# Patient Record
Sex: Female | Born: 1950 | Race: White | Hispanic: No | Marital: Married | Smoking: Never smoker
Health system: Southern US, Community
[De-identification: ages and names within clinical notes are randomized; demographics above are authoritative.]

## PROBLEM LIST (undated history)

## (undated) DIAGNOSIS — C44519 Basal cell carcinoma of skin of other part of trunk: Secondary | ICD-10-CM

## (undated) DIAGNOSIS — K219 Gastro-esophageal reflux disease without esophagitis: Secondary | ICD-10-CM

## (undated) DIAGNOSIS — J302 Other seasonal allergic rhinitis: Secondary | ICD-10-CM

## (undated) DIAGNOSIS — T883XXA Malignant hyperthermia due to anesthesia, initial encounter: Secondary | ICD-10-CM

## (undated) DIAGNOSIS — H409 Unspecified glaucoma: Secondary | ICD-10-CM

## (undated) DIAGNOSIS — Z8489 Family history of other specified conditions: Secondary | ICD-10-CM

## (undated) DIAGNOSIS — M810 Age-related osteoporosis without current pathological fracture: Secondary | ICD-10-CM

## (undated) HISTORY — PX: ABDOMINAL HYSTERECTOMY: SHX81

## (undated) HISTORY — PX: APPENDECTOMY: SHX54

## (undated) HISTORY — PX: OTHER SURGICAL HISTORY: SHX169

---

## 2013-05-19 ENCOUNTER — Encounter (HOSPITAL_COMMUNITY): Payer: Self-pay

## 2013-05-19 ENCOUNTER — Encounter (HOSPITAL_COMMUNITY)
Admission: RE | Admit: 2013-05-19 | Discharge: 2013-05-19 | Disposition: A | Discharge status: Home / Self Care | Payer: No Typology Code available for payment source | Source: Ambulatory Visit | Attending: Internal Medicine | Admitting: Internal Medicine

## 2013-05-19 DIAGNOSIS — M810 Age-related osteoporosis without current pathological fracture: Secondary | ICD-10-CM | POA: Insufficient documentation

## 2013-05-19 HISTORY — DX: Family history of other specified conditions: Z84.89

## 2013-05-19 HISTORY — DX: Gastro-esophageal reflux disease without esophagitis: K21.9

## 2013-05-19 LAB — COMPREHENSIVE METABOLIC PANEL
ALT: 18 U/L (ref 0–35)
AST: 21 U/L (ref 0–37)
Albumin: 3.9 g/dL (ref 3.5–5.2)
Chloride: 103 mEq/L (ref 96–112)
Creatinine, Ser: 0.79 mg/dL (ref 0.50–1.10)
Total Bilirubin: 0.2 mg/dL — ABNORMAL LOW (ref 0.3–1.2)
Total Protein: 7.6 g/dL (ref 6.0–8.3)

## 2013-05-19 LAB — MAGNESIUM: Magnesium: 2.1 mg/dL (ref 1.5–2.5)

## 2013-05-19 LAB — PHOSPHORUS: Phosphorus: 2.8 mg/dL (ref 2.3–4.6)

## 2013-05-19 MED ORDER — DENOSUMAB 60 MG/ML ~~LOC~~ SOLN
60.0000 mg | Freq: Once | SUBCUTANEOUS | Status: AC
  Administered 2013-05-19: 60 mg via SUBCUTANEOUS
  Filled 2013-05-19: qty 1

## 2013-05-19 NOTE — Progress Notes (Signed)
Results for AJAHNAE, RATHGEBER (MRN [id number]) as of 05/19/2013 12:50 Results faxed to Dr. Dwana Melena.  Consent for Prolia obtained along with injection given  Ref. Range 05/19/2013 12:05  Sodium Latest Range: 135-145 mEq/L 140  Potassium Latest Range: 3.5-5.1 mEq/L 4.1  Chloride Latest Range: 96-112 mEq/L 103  CO2 Latest Range: 19-32 mEq/L 28  BUN Latest Range: 6-23 mg/dL 18  Creatinine Latest Range: 0.50-1.10 mg/dL 1.47  Calcium Latest Range: 8.4-10.5 mg/dL 9.8  GFR calc non Af Amer Latest Range: >90 mL/min 86 (L)  GFR calc Af Amer Latest Range: >90 mL/min >90  Glucose Latest Range: 70-99 mg/dL 829 (H)  Phosphorus Latest Range: 2.3-4.6 mg/dL 2.8  Magnesium Latest Range: 1.5-2.5 mg/dL 2.1  Alkaline Phosphatase Latest Range: 39-117 U/L 82  Albumin Latest Range: 3.5-5.2 g/dL 3.9  AST Latest Range: 0-37 U/L 21  ALT Latest Range: 0-35 U/L 18  Total Protein Latest Range: 6.0-8.3 g/dL 7.6  Total Bilirubin Latest Range: 0.3-1.2 mg/dL 0.2 (L)

## 2013-11-10 NOTE — Progress Notes (Signed)
Prolia injection/labs are due for renewal at [facility]. Order sheet faxed to office. Call for questions [phone].

## 2013-11-18 ENCOUNTER — Encounter (HOSPITAL_COMMUNITY)
Admission: RE | Admit: 2013-11-18 | Discharge: 2013-11-18 | Disposition: A | Discharge status: Home / Self Care | Payer: Medicare HMO | Source: Ambulatory Visit | Attending: Internal Medicine | Admitting: Internal Medicine

## 2013-11-18 DIAGNOSIS — M810 Age-related osteoporosis without current pathological fracture: Secondary | ICD-10-CM | POA: Insufficient documentation

## 2013-11-18 LAB — COMPREHENSIVE METABOLIC PANEL
ALT: 18 U/L (ref 0–35)
AST: 22 U/L (ref 0–37)
Albumin: 3.7 g/dL (ref 3.5–5.2)
Alkaline Phosphatase: 71 U/L (ref 39–117)
BUN: 16 mg/dL (ref 6–23)
CALCIUM: 9.3 mg/dL (ref 8.4–10.5)
CO2: 29 mEq/L (ref 19–32)
Chloride: 104 mEq/L (ref 96–112)
Creatinine, Ser: 0.73 mg/dL (ref 0.50–1.10)
GFR calc non Af Amer: 88 mL/min — ABNORMAL LOW (ref >90)
GLUCOSE: 98 mg/dL (ref 70–99)
Potassium: 4.1 mEq/L (ref 3.7–5.3)
Sodium: 142 mEq/L (ref 137–147)
TOTAL PROTEIN: 7.7 g/dL (ref 6.0–8.3)
Total Bilirubin: 0.3 mg/dL (ref 0.3–1.2)

## 2013-11-18 LAB — PHOSPHORUS: Phosphorus: 2.9 mg/dL (ref 2.3–4.6)

## 2013-11-18 LAB — MAGNESIUM: MAGNESIUM: 2.1 mg/dL (ref 1.5–2.5)

## 2013-11-18 MED ORDER — DENOSUMAB 60 MG/ML ~~LOC~~ SOLN
60.0000 mg | Freq: Once | SUBCUTANEOUS | Status: AC
  Administered 2013-11-18: 60 mg via SUBCUTANEOUS
  Filled 2013-11-18: qty 1

## 2014-05-18 ENCOUNTER — Other Ambulatory Visit (HOSPITAL_COMMUNITY): Payer: Self-pay | Admitting: Internal Medicine

## 2014-05-18 DIAGNOSIS — Z1231 Encounter for screening mammogram for malignant neoplasm of breast: Secondary | ICD-10-CM

## 2014-05-18 DIAGNOSIS — M810 Age-related osteoporosis without current pathological fracture: Secondary | ICD-10-CM

## 2014-05-19 ENCOUNTER — Other Ambulatory Visit (HOSPITAL_COMMUNITY): Payer: Self-pay | Admitting: Respiratory Therapy

## 2014-05-19 DIAGNOSIS — R0602 Shortness of breath: Secondary | ICD-10-CM

## 2014-05-20 ENCOUNTER — Ambulatory Visit (HOSPITAL_COMMUNITY)
Admission: RE | Admit: 2014-05-20 | Discharge: 2014-05-20 | Disposition: A | Discharge status: Home / Self Care | Payer: Medicare HMO | Source: Ambulatory Visit | Attending: Internal Medicine | Admitting: Internal Medicine

## 2014-05-20 ENCOUNTER — Encounter (HOSPITAL_COMMUNITY): Payer: Self-pay

## 2014-05-20 ENCOUNTER — Encounter (HOSPITAL_COMMUNITY)
Admission: RE | Admit: 2014-05-20 | Discharge: 2014-05-20 | Disposition: A | Discharge status: Home / Self Care | Payer: Medicare HMO | Source: Ambulatory Visit | Attending: Internal Medicine | Admitting: Internal Medicine

## 2014-05-20 ENCOUNTER — Other Ambulatory Visit (HOSPITAL_COMMUNITY): Payer: Self-pay | Admitting: Internal Medicine

## 2014-05-20 ENCOUNTER — Other Ambulatory Visit (HOSPITAL_COMMUNITY): Payer: Medicare HMO

## 2014-05-20 ENCOUNTER — Ambulatory Visit (HOSPITAL_COMMUNITY): Payer: Medicare HMO

## 2014-05-20 ENCOUNTER — Encounter (HOSPITAL_COMMUNITY): Admission: RE | Admit: 2014-05-20 | Payer: Medicare HMO | Source: Ambulatory Visit

## 2014-05-20 DIAGNOSIS — R0602 Shortness of breath: Secondary | ICD-10-CM

## 2014-05-20 DIAGNOSIS — R059 Cough, unspecified: Secondary | ICD-10-CM

## 2014-05-20 DIAGNOSIS — M810 Age-related osteoporosis without current pathological fracture: Secondary | ICD-10-CM | POA: Diagnosis present

## 2014-05-20 DIAGNOSIS — R05 Cough: Secondary | ICD-10-CM | POA: Diagnosis not present

## 2014-05-20 HISTORY — DX: Malignant hyperthermia due to anesthesia, initial encounter: T88.3XXA

## 2014-05-20 HISTORY — DX: Unspecified glaucoma: H40.9

## 2014-05-20 MED ORDER — DENOSUMAB 60 MG/ML ~~LOC~~ SOLN
60.0000 mg | Freq: Once | SUBCUTANEOUS | Status: AC
  Administered 2014-05-20: 60 mg via SUBCUTANEOUS
  Filled 2014-05-20: qty 1

## 2014-05-25 ENCOUNTER — Ambulatory Visit (HOSPITAL_COMMUNITY)
Admission: RE | Admit: 2014-05-25 | Discharge: 2014-05-25 | Disposition: A | Discharge status: Home / Self Care | Payer: Medicare HMO | Source: Ambulatory Visit | Attending: Internal Medicine | Admitting: Internal Medicine

## 2014-05-25 DIAGNOSIS — M810 Age-related osteoporosis without current pathological fracture: Secondary | ICD-10-CM

## 2014-05-25 DIAGNOSIS — M818 Other osteoporosis without current pathological fracture: Secondary | ICD-10-CM | POA: Diagnosis present

## 2014-05-26 ENCOUNTER — Ambulatory Visit (HOSPITAL_COMMUNITY)
Admission: RE | Admit: 2014-05-26 | Discharge: 2014-05-26 | Disposition: A | Discharge status: Home / Self Care | Payer: Medicare HMO | Source: Ambulatory Visit | Attending: Internal Medicine | Admitting: Internal Medicine

## 2014-05-26 DIAGNOSIS — R0602 Shortness of breath: Secondary | ICD-10-CM | POA: Insufficient documentation

## 2014-05-26 MED ORDER — ALBUTEROL SULFATE (2.5 MG/3ML) 0.083% IN NEBU
2.5000 mg | INHALATION_SOLUTION | Freq: Once | RESPIRATORY_TRACT | Status: AC
  Administered 2014-05-26: 2.5 mg via RESPIRATORY_TRACT

## 2014-05-27 ENCOUNTER — Ambulatory Visit (HOSPITAL_COMMUNITY)
Admission: RE | Admit: 2014-05-27 | Discharge: 2014-05-27 | Disposition: A | Discharge status: Home / Self Care | Payer: Medicare HMO | Source: Ambulatory Visit | Attending: Internal Medicine | Admitting: Internal Medicine

## 2014-05-27 DIAGNOSIS — Z1231 Encounter for screening mammogram for malignant neoplasm of breast: Secondary | ICD-10-CM | POA: Insufficient documentation

## 2014-05-27 LAB — PULMONARY FUNCTION TEST
DL/VA % PRED: 97 %
DL/VA: 4.29 ml/min/mmHg/L
DLCO UNC % PRED: 82 %
DLCO cor % pred: 82 %
DLCO cor: 16.65 ml/min/mmHg
DLCO unc: 16.65 ml/min/mmHg
FEF 25-75 Post: 2.45 L/sec
FEF 25-75 Pre: 2.24 L/sec
FEF2575-%Change-Post: 9 %
FEF2575-%PRED-POST: 126 %
FEF2575-%Pred-Pre: 116 %
FEV1-%Change-Post: 2 %
FEV1-%PRED-POST: 109 %
FEV1-%Pred-Pre: 107 %
FEV1-PRE: 2.27 L
FEV1-Post: 2.32 L
FEV1FVC-%Change-Post: 5 %
FEV1FVC-%PRED-PRE: 105 %
FEV6-%Change-Post: -2 %
FEV6-%PRED-PRE: 105 %
FEV6-%Pred-Post: 102 %
FEV6-POST: 2.72 L
FEV6-Pre: 2.81 L
FEV6FVC-%CHANGE-POST: 0 %
FEV6FVC-%PRED-PRE: 104 %
FEV6FVC-%Pred-Post: 104 %
FVC-%Change-Post: -3 %
FVC-%PRED-PRE: 101 %
FVC-%Pred-Post: 97 %
FVC-PRE: 2.81 L
FVC-Post: 2.72 L
PRE FEV6/FVC RATIO: 100 %
Post FEV1/FVC ratio: 85 %
Post FEV6/FVC ratio: 100 %
Pre FEV1/FVC ratio: 81 %
RV % PRED: 98 %
RV: 1.93 L
TLC % pred: 99 %
TLC: 4.56 L

## 2014-11-19 ENCOUNTER — Encounter (HOSPITAL_COMMUNITY)
Admission: RE | Admit: 2014-11-19 | Discharge: 2014-11-19 | Disposition: A | Discharge status: Home / Self Care | Payer: Medicare PPO | Source: Ambulatory Visit | Attending: Internal Medicine | Admitting: Internal Medicine

## 2014-11-19 ENCOUNTER — Encounter (HOSPITAL_COMMUNITY): Payer: Self-pay

## 2014-11-19 DIAGNOSIS — M810 Age-related osteoporosis without current pathological fracture: Secondary | ICD-10-CM | POA: Diagnosis not present

## 2014-11-19 LAB — COMPREHENSIVE METABOLIC PANEL
ALBUMIN: 4.3 g/dL (ref 3.5–5.2)
ALT: 27 U/L (ref 0–35)
ANION GAP: 8 (ref 5–15)
AST: 25 U/L (ref 0–37)
Alkaline Phosphatase: 60 U/L (ref 39–117)
BILIRUBIN TOTAL: 0.5 mg/dL (ref 0.3–1.2)
BUN: 19 mg/dL (ref 6–23)
CO2: 26 mmol/L (ref 19–32)
CREATININE: 0.84 mg/dL (ref 0.50–1.10)
Calcium: 9.6 mg/dL (ref 8.4–10.5)
Chloride: 105 mmol/L (ref 96–112)
GFR calc Af Amer: 82 mL/min — ABNORMAL LOW (ref >90)
GFR, EST NON AFRICAN AMERICAN: 71 mL/min — AB (ref >90)
GLUCOSE: 103 mg/dL — AB (ref 70–99)
Potassium: 4.1 mmol/L (ref 3.5–5.1)
SODIUM: 139 mmol/L (ref 135–145)
TOTAL PROTEIN: 7.7 g/dL (ref 6.0–8.3)

## 2014-11-19 LAB — MAGNESIUM: Magnesium: 2.1 mg/dL (ref 1.5–2.5)

## 2014-11-19 LAB — PHOSPHORUS: Phosphorus: 3.6 mg/dL (ref 2.3–4.6)

## 2014-11-19 MED ORDER — DENOSUMAB 60 MG/ML ~~LOC~~ SOLN
60.0000 mg | Freq: Once | SUBCUTANEOUS | Status: AC
  Administered 2014-11-19: 60 mg via SUBCUTANEOUS
  Filled 2014-11-19: qty 1

## 2014-11-19 NOTE — Progress Notes (Signed)
Results for Karen Freeman, Karen Freeman (MRN [id number]) as of 11/19/2014 09:03  Ref. Range 11/19/2014 08:20  Sodium Latest Ref Range: 135-145 mmol/L 139  Potassium Latest Ref Range: 3.5-5.1 mmol/L 4.1  Chloride Latest Ref Range: 96-112 mmol/L 105  CO2 Latest Ref Range: 19-32 mmol/L 26  BUN Latest Ref Range: 6-23 mg/dL 19  Creatinine Latest Ref Range: 0.50-1.10 mg/dL 0.84  Calcium Latest Ref Range: 8.4-10.5 mg/dL 9.6  EGFR (Non-African Amer.) Latest Ref Range: >90 mL/min 71 (L)  EGFR (African American) Latest Ref Range: >90 mL/min 82 (L)  Glucose Latest Ref Range: 70-99 mg/dL 103 (H)  Anion gap Latest Ref Range: 5-15  8  Phosphorus Latest Ref Range: 2.3-4.6 mg/dL 3.6  Magnesium Latest Ref Range: 1.5-2.5 mg/dL 2.1  Alkaline Phosphatase Latest Ref Range: 39-117 U/L 60  Albumin Latest Ref Range: 3.5-5.2 g/dL 4.3  AST Latest Ref Range: 0-37 U/L 25  ALT Latest Ref Range: 0-35 U/L 27  Total Protein Latest Ref Range: 6.0-8.3 g/dL 7.7  Total Bilirubin Latest Ref Range: 0.3-1.2 mg/dL 0.5

## 2015-05-21 ENCOUNTER — Encounter (HOSPITAL_COMMUNITY): Admission: RE | Admit: 2015-05-21 | Payer: Medicare PPO | Source: Ambulatory Visit

## 2015-05-21 ENCOUNTER — Encounter (HOSPITAL_COMMUNITY): Payer: Medicare PPO

## 2015-05-31 ENCOUNTER — Encounter (HOSPITAL_COMMUNITY)
Admission: RE | Admit: 2015-05-31 | Discharge: 2015-05-31 | Disposition: A | Discharge status: Home / Self Care | Payer: Medicare PPO | Source: Ambulatory Visit | Attending: Internal Medicine | Admitting: Internal Medicine

## 2015-05-31 DIAGNOSIS — M810 Age-related osteoporosis without current pathological fracture: Secondary | ICD-10-CM | POA: Diagnosis not present

## 2015-05-31 MED ORDER — DENOSUMAB 60 MG/ML ~~LOC~~ SOLN
60.0000 mg | Freq: Once | SUBCUTANEOUS | Status: AC
  Administered 2015-05-31: 60 mg via SUBCUTANEOUS
  Filled 2015-05-31: qty 1

## 2015-11-22 ENCOUNTER — Encounter (HOSPITAL_COMMUNITY)
Admission: RE | Admit: 2015-11-22 | Discharge: 2015-11-22 | Disposition: A | Discharge status: Home / Self Care | Payer: Medicare PPO | Source: Ambulatory Visit | Attending: Internal Medicine | Admitting: Internal Medicine

## 2015-11-22 DIAGNOSIS — M810 Age-related osteoporosis without current pathological fracture: Secondary | ICD-10-CM | POA: Diagnosis present

## 2015-11-22 MED ORDER — DENOSUMAB 60 MG/ML ~~LOC~~ SOLN
60.0000 mg | Freq: Once | SUBCUTANEOUS | Status: AC
  Administered 2015-11-22: 60 mg via SUBCUTANEOUS
  Filled 2015-11-22: qty 1

## 2016-05-23 ENCOUNTER — Encounter (HOSPITAL_COMMUNITY): Payer: Self-pay

## 2016-05-23 ENCOUNTER — Encounter (HOSPITAL_COMMUNITY)
Admission: RE | Admit: 2016-05-23 | Discharge: 2016-05-23 | Disposition: A | Discharge status: Home / Self Care | Payer: Medicare PPO | Source: Ambulatory Visit | Attending: Internal Medicine | Admitting: Internal Medicine

## 2016-05-23 DIAGNOSIS — M810 Age-related osteoporosis without current pathological fracture: Secondary | ICD-10-CM | POA: Insufficient documentation

## 2016-05-23 MED ORDER — DENOSUMAB 60 MG/ML ~~LOC~~ SOLN
60.0000 mg | Freq: Once | SUBCUTANEOUS | Status: AC
  Administered 2016-05-23: 60 mg via SUBCUTANEOUS
  Filled 2016-05-23: qty 1

## 2016-05-31 ENCOUNTER — Other Ambulatory Visit (HOSPITAL_COMMUNITY): Payer: Self-pay | Admitting: Internal Medicine

## 2016-05-31 DIAGNOSIS — Z1231 Encounter for screening mammogram for malignant neoplasm of breast: Secondary | ICD-10-CM

## 2016-05-31 DIAGNOSIS — Z780 Asymptomatic menopausal state: Secondary | ICD-10-CM

## 2016-06-14 ENCOUNTER — Encounter (HOSPITAL_COMMUNITY): Payer: Self-pay | Admitting: Radiology

## 2016-06-14 ENCOUNTER — Ambulatory Visit (HOSPITAL_COMMUNITY)
Admission: RE | Admit: 2016-06-14 | Discharge: 2016-06-14 | Disposition: A | Discharge status: Home / Self Care | Payer: Medicare PPO | Source: Ambulatory Visit | Attending: Internal Medicine | Admitting: Internal Medicine

## 2016-06-14 DIAGNOSIS — M810 Age-related osteoporosis without current pathological fracture: Secondary | ICD-10-CM | POA: Insufficient documentation

## 2016-06-14 DIAGNOSIS — Z780 Asymptomatic menopausal state: Secondary | ICD-10-CM | POA: Insufficient documentation

## 2016-06-14 DIAGNOSIS — Z1231 Encounter for screening mammogram for malignant neoplasm of breast: Secondary | ICD-10-CM | POA: Diagnosis not present

## 2016-10-19 DIAGNOSIS — D2272 Melanocytic nevi of left lower limb, including hip: Secondary | ICD-10-CM | POA: Diagnosis not present

## 2016-10-19 DIAGNOSIS — D1801 Hemangioma of skin and subcutaneous tissue: Secondary | ICD-10-CM | POA: Diagnosis not present

## 2016-10-19 DIAGNOSIS — Z85828 Personal history of other malignant neoplasm of skin: Secondary | ICD-10-CM | POA: Diagnosis not present

## 2016-10-19 DIAGNOSIS — D2271 Melanocytic nevi of right lower limb, including hip: Secondary | ICD-10-CM | POA: Diagnosis not present

## 2016-10-19 DIAGNOSIS — L821 Other seborrheic keratosis: Secondary | ICD-10-CM | POA: Diagnosis not present

## 2016-10-19 DIAGNOSIS — D225 Melanocytic nevi of trunk: Secondary | ICD-10-CM | POA: Diagnosis not present

## 2016-10-19 DIAGNOSIS — L814 Other melanin hyperpigmentation: Secondary | ICD-10-CM | POA: Diagnosis not present

## 2016-10-19 DIAGNOSIS — D2262 Melanocytic nevi of left upper limb, including shoulder: Secondary | ICD-10-CM | POA: Diagnosis not present

## 2016-11-09 DIAGNOSIS — E782 Mixed hyperlipidemia: Secondary | ICD-10-CM | POA: Diagnosis not present

## 2016-11-09 DIAGNOSIS — I10 Essential (primary) hypertension: Secondary | ICD-10-CM | POA: Diagnosis not present

## 2016-11-09 DIAGNOSIS — R7301 Impaired fasting glucose: Secondary | ICD-10-CM | POA: Diagnosis not present

## 2016-11-14 DIAGNOSIS — R7301 Impaired fasting glucose: Secondary | ICD-10-CM | POA: Diagnosis not present

## 2016-11-14 DIAGNOSIS — J45998 Other asthma: Secondary | ICD-10-CM | POA: Diagnosis not present

## 2016-11-14 DIAGNOSIS — E782 Mixed hyperlipidemia: Secondary | ICD-10-CM | POA: Diagnosis not present

## 2016-11-14 DIAGNOSIS — K219 Gastro-esophageal reflux disease without esophagitis: Secondary | ICD-10-CM | POA: Diagnosis not present

## 2016-11-14 DIAGNOSIS — Z6826 Body mass index (BMI) 26.0-26.9, adult: Secondary | ICD-10-CM | POA: Diagnosis not present

## 2016-11-14 DIAGNOSIS — M810 Age-related osteoporosis without current pathological fracture: Secondary | ICD-10-CM | POA: Diagnosis not present

## 2016-11-21 ENCOUNTER — Encounter (HOSPITAL_COMMUNITY)
Admission: RE | Admit: 2016-11-21 | Discharge: 2016-11-21 | Disposition: A | Discharge status: Home / Self Care | Payer: Medicare HMO | Source: Ambulatory Visit | Attending: Internal Medicine | Admitting: Internal Medicine

## 2016-11-21 DIAGNOSIS — M810 Age-related osteoporosis without current pathological fracture: Secondary | ICD-10-CM | POA: Diagnosis not present

## 2016-11-21 MED ORDER — DENOSUMAB 60 MG/ML ~~LOC~~ SOLN
60.0000 mg | Freq: Once | SUBCUTANEOUS | Status: AC
  Administered 2016-11-21: 60 mg via SUBCUTANEOUS
  Filled 2016-11-21: qty 1

## 2016-11-24 DIAGNOSIS — H401111 Primary open-angle glaucoma, right eye, mild stage: Secondary | ICD-10-CM | POA: Diagnosis not present

## 2016-11-24 DIAGNOSIS — H2513 Age-related nuclear cataract, bilateral: Secondary | ICD-10-CM | POA: Diagnosis not present

## 2016-11-24 DIAGNOSIS — H52203 Unspecified astigmatism, bilateral: Secondary | ICD-10-CM | POA: Diagnosis not present

## 2016-12-21 DIAGNOSIS — Z0000 Encounter for general adult medical examination without abnormal findings: Secondary | ICD-10-CM | POA: Diagnosis not present

## 2017-05-14 DIAGNOSIS — R7301 Impaired fasting glucose: Secondary | ICD-10-CM | POA: Diagnosis not present

## 2017-05-14 DIAGNOSIS — E782 Mixed hyperlipidemia: Secondary | ICD-10-CM | POA: Diagnosis not present

## 2017-05-16 DIAGNOSIS — J45998 Other asthma: Secondary | ICD-10-CM | POA: Diagnosis not present

## 2017-05-16 DIAGNOSIS — E782 Mixed hyperlipidemia: Secondary | ICD-10-CM | POA: Diagnosis not present

## 2017-05-16 DIAGNOSIS — Z23 Encounter for immunization: Secondary | ICD-10-CM | POA: Diagnosis not present

## 2017-05-16 DIAGNOSIS — R7301 Impaired fasting glucose: Secondary | ICD-10-CM | POA: Diagnosis not present

## 2017-05-16 DIAGNOSIS — M810 Age-related osteoporosis without current pathological fracture: Secondary | ICD-10-CM | POA: Diagnosis not present

## 2017-05-16 DIAGNOSIS — Z6823 Body mass index (BMI) 23.0-23.9, adult: Secondary | ICD-10-CM | POA: Diagnosis not present

## 2017-05-16 DIAGNOSIS — K219 Gastro-esophageal reflux disease without esophagitis: Secondary | ICD-10-CM | POA: Diagnosis not present

## 2017-05-18 ENCOUNTER — Other Ambulatory Visit (HOSPITAL_COMMUNITY): Payer: Self-pay | Admitting: Internal Medicine

## 2017-05-18 DIAGNOSIS — Z1231 Encounter for screening mammogram for malignant neoplasm of breast: Secondary | ICD-10-CM

## 2017-05-23 ENCOUNTER — Encounter (HOSPITAL_COMMUNITY): Payer: Medicare HMO

## 2017-05-25 ENCOUNTER — Encounter (HOSPITAL_COMMUNITY): Payer: Medicare HMO

## 2017-05-31 DIAGNOSIS — H401111 Primary open-angle glaucoma, right eye, mild stage: Secondary | ICD-10-CM | POA: Diagnosis not present

## 2017-06-01 ENCOUNTER — Encounter (HOSPITAL_COMMUNITY)
Admission: RE | Admit: 2017-06-01 | Discharge: 2017-06-01 | Disposition: A | Discharge status: Home / Self Care | Payer: Medicare HMO | Source: Ambulatory Visit | Attending: Internal Medicine | Admitting: Internal Medicine

## 2017-06-01 ENCOUNTER — Encounter (HOSPITAL_COMMUNITY): Payer: Self-pay

## 2017-06-01 DIAGNOSIS — Z780 Asymptomatic menopausal state: Secondary | ICD-10-CM | POA: Diagnosis not present

## 2017-06-01 DIAGNOSIS — M810 Age-related osteoporosis without current pathological fracture: Secondary | ICD-10-CM | POA: Diagnosis not present

## 2017-06-01 MED ORDER — DENOSUMAB 60 MG/ML ~~LOC~~ SOLN
60.0000 mg | Freq: Once | SUBCUTANEOUS | Status: AC
  Administered 2017-06-01: 60 mg via SUBCUTANEOUS
  Filled 2017-06-01: qty 1

## 2017-06-18 ENCOUNTER — Ambulatory Visit (HOSPITAL_COMMUNITY): Payer: Medicare HMO

## 2017-06-20 ENCOUNTER — Telehealth: Payer: Self-pay

## 2017-06-20 ENCOUNTER — Encounter (HOSPITAL_COMMUNITY): Payer: Self-pay

## 2017-06-20 ENCOUNTER — Ambulatory Visit (HOSPITAL_COMMUNITY)
Admission: RE | Admit: 2017-06-20 | Discharge: 2017-06-20 | Disposition: A | Discharge status: Home / Self Care | Payer: Medicare HMO | Source: Ambulatory Visit | Attending: Internal Medicine | Admitting: Internal Medicine

## 2017-06-20 DIAGNOSIS — Z1231 Encounter for screening mammogram for malignant neoplasm of breast: Secondary | ICD-10-CM | POA: Diagnosis not present

## 2017-06-20 NOTE — Telephone Encounter (Signed)
Pt received a triage letter to call DS. No GI problems, no blood thinners or hx of heart attacks. Please call [phone] or she said to [facility] because of bad reception in [place].

## 2017-07-02 ENCOUNTER — Telehealth: Payer: Self-pay

## 2017-07-02 NOTE — Telephone Encounter (Signed)
See separate triage.  

## 2017-07-23 NOTE — Telephone Encounter (Signed)
Gastroenterology Pre-Procedure Review  Request Date: 08/10/2017 Requesting Physician: Dr. Wende Neighbors  PATIENT REVIEW QUESTIONS: The patient responded to the following health history questions as indicated:    Last colonoscopy 10 years ago in [place]   No polyps  1. Diabetes Melitis: no 2. Joint replacements in the past 12 months: no 3. Major health problems in the past 3 months: no 4. Has an artificial valve or MVP: no 5. Has a defibrillator: no 6. Has been advised in past to take antibiotics in advance of a procedure like teeth cleaning: no 7. Family history of colon cancer: no  8. Alcohol Use: Drinks a glass of wine 2-3 times a week with dinner 9. History of sleep apnea: no  10. History of coronary artery or other vascular stents placed within the last 12 months: no 11. History of any prior anesthesia complications: no  ( BUT SISTER HAS HYPOTHERMIA WITH ANESTHESIA)    MEDICATIONS & ALLERGIES:    Patient reports the following regarding taking any blood thinners:   Plavix? no Aspirin? no Coumadin? no Brilinta? no Xarelto? no Eliquis? no Pradaxa? no Savaysa? no Effient? no  Patient confirms/reports the following medications:  Current Outpatient Medications  Medication Sig Dispense Refill  . calcium citrate-vitamin D (CITRACAL+D) 315-200 MG-UNIT per tablet Take 1 tablet by mouth 2 (two) times daily.    . cholecalciferol (VITAMIN D) 1000 UNITS tablet Take 2,000 Units by mouth daily.    Marland Kitchen denosumab (PROLIA) 60 MG/ML SOLN injection Inject 60 mg into the skin every 6 (six) months. Administer in upper arm, thigh, or abdomen    . esomeprazole (NEXIUM) 40 MG capsule Take 40 mg by mouth daily before breakfast.    . loratadine (CLARITIN) 10 MG tablet Take 10 mg by mouth daily.    . Multiple Vitamin (MULTIVITAMIN) capsule Take 1 capsule by mouth daily.    . Travoprost, BAK Free, (TRAVATAN) 0.004 % SOLN ophthalmic solution Place 1 drop into both eyes at bedtime.    . vitamin B-12  (CYANOCOBALAMIN) 1000 MCG tablet Take 1,000 mcg by mouth daily.    . vitamin C (ASCORBIC ACID) 500 MG tablet Take 500 mg by mouth daily.    . meloxicam (MOBIC) 15 MG tablet Take 15 mg by mouth daily.     No current facility-administered medications for this visit.     Patient confirms/reports the following allergies:  No Known Allergies  No orders of the defined types were placed in this encounter.   AUTHORIZATION INFORMATION Primary Insurance:   ID #:  Group #:  Pre-Cert / Auth required:  Pre-Cert / Auth #:   Secondary Insurance:   ID #: Group #:  Pre-Cert / Auth required: Pre-Cert / Auth #:   SCHEDULE INFORMATION: Procedure has been scheduled as follows:  Date: 08/10/2017                  Time:  8:30 am Location: [facility] Short Stay  This Gastroenterology Pre-Precedure Review Form is being routed to the following provider(s): Barney Drain, MD

## 2017-07-27 NOTE — Telephone Encounter (Signed)
Has she ever been tested for malignant hyperthermia susceptibility? If she has never had any issues herself with anesthesia, should not be an issue, but I was wondering if she had even been tested as she has a sister with this. We may want to run this by Dr. Oneida Alar.

## 2017-07-30 NOTE — Telephone Encounter (Signed)
Pt said she has never been tested. She has had anesthesia previously and never had any problems. She is aware we will wait to schedule after Dr. Oneida Alar reviews and advises.

## 2017-08-06 NOTE — Telephone Encounter (Signed)
REVIEWED. SUPREP. CLEAR LIQUIDS AFTER 9 AM ON DAY BEFORE TCS.

## 2017-08-09 ENCOUNTER — Other Ambulatory Visit: Payer: Self-pay

## 2017-08-09 DIAGNOSIS — Z1211 Encounter for screening for malignant neoplasm of colon: Secondary | ICD-10-CM

## 2017-08-09 MED ORDER — NA SULFATE-K SULFATE-MG SULF 17.5-3.13-1.6 GM/177ML PO SOLN: 1.0000 | ORAL | 0 refills | Status: DC

## 2017-08-09 NOTE — Telephone Encounter (Signed)
PT is scheduled for 09/03/2017 at 9:30 AM with Dr. Oneida Alar. Rx sent to the pharmacy and instructions mailed to pt.

## 2017-08-22 ENCOUNTER — Telehealth: Payer: Self-pay

## 2017-08-22 NOTE — Telephone Encounter (Signed)
Pt called and cancelled colonoscopy for 09/17/17 due to family member death in [place]. She will call back to reschedule when she is sure she can be back home. Kim in Endo is aware. Pt has her prep and will just need new instructions when she reschedules.

## 2017-09-03 ENCOUNTER — Encounter (HOSPITAL_COMMUNITY): Payer: Self-pay

## 2017-09-03 ENCOUNTER — Ambulatory Visit (HOSPITAL_COMMUNITY): Admit: 2017-09-03 | Payer: Medicare HMO | Admitting: Gastroenterology

## 2017-09-03 ENCOUNTER — Other Ambulatory Visit: Payer: Self-pay

## 2017-09-03 DIAGNOSIS — Z1211 Encounter for screening for malignant neoplasm of colon: Secondary | ICD-10-CM

## 2017-09-03 SURGERY — COLONOSCOPY
Anesthesia: Moderate Sedation

## 2017-09-03 NOTE — Telephone Encounter (Signed)
Pt called and rescheduled her colonoscopy for 09/07/2017 at 9:30 Am. She is aware to be at the hospital at 8:30 Am. I am leaving the new instructions at the front desk for her to pick up. She has the [place] and has not opened it.

## 2017-09-07 ENCOUNTER — Other Ambulatory Visit: Payer: Self-pay

## 2017-09-07 ENCOUNTER — Ambulatory Visit (HOSPITAL_COMMUNITY)
Admission: RE | Admit: 2017-09-07 | Discharge: 2017-09-07 | Disposition: A | Discharge status: Home / Self Care | Payer: Medicare HMO | Source: Ambulatory Visit | Attending: Gastroenterology | Admitting: Gastroenterology

## 2017-09-07 ENCOUNTER — Encounter (HOSPITAL_COMMUNITY)
Admission: RE | Disposition: A | Discharge status: Home / Self Care | Payer: Self-pay | Source: Ambulatory Visit | Attending: Gastroenterology

## 2017-09-07 ENCOUNTER — Encounter (HOSPITAL_COMMUNITY): Payer: Self-pay | Admitting: *Deleted

## 2017-09-07 DIAGNOSIS — K635 Polyp of colon: Secondary | ICD-10-CM | POA: Diagnosis not present

## 2017-09-07 DIAGNOSIS — Z1212 Encounter for screening for malignant neoplasm of rectum: Secondary | ICD-10-CM

## 2017-09-07 DIAGNOSIS — K621 Rectal polyp: Secondary | ICD-10-CM | POA: Diagnosis not present

## 2017-09-07 DIAGNOSIS — Z79899 Other long term (current) drug therapy: Secondary | ICD-10-CM | POA: Diagnosis not present

## 2017-09-07 DIAGNOSIS — Z1211 Encounter for screening for malignant neoplasm of colon: Secondary | ICD-10-CM

## 2017-09-07 DIAGNOSIS — Z8249 Family history of ischemic heart disease and other diseases of the circulatory system: Secondary | ICD-10-CM | POA: Diagnosis not present

## 2017-09-07 DIAGNOSIS — K5730 Diverticulosis of large intestine without perforation or abscess without bleeding: Secondary | ICD-10-CM | POA: Insufficient documentation

## 2017-09-07 DIAGNOSIS — Z85828 Personal history of other malignant neoplasm of skin: Secondary | ICD-10-CM | POA: Insufficient documentation

## 2017-09-07 DIAGNOSIS — K219 Gastro-esophageal reflux disease without esophagitis: Secondary | ICD-10-CM | POA: Diagnosis not present

## 2017-09-07 DIAGNOSIS — K648 Other hemorrhoids: Secondary | ICD-10-CM | POA: Insufficient documentation

## 2017-09-07 DIAGNOSIS — K644 Residual hemorrhoidal skin tags: Secondary | ICD-10-CM | POA: Insufficient documentation

## 2017-09-07 HISTORY — DX: Basal cell carcinoma of skin of other part of trunk: C44.519

## 2017-09-07 HISTORY — PX: COLONOSCOPY: SHX5424

## 2017-09-07 HISTORY — DX: Other seasonal allergic rhinitis: J30.2

## 2017-09-07 SURGERY — COLONOSCOPY
Anesthesia: Moderate Sedation

## 2017-09-07 MED ORDER — MIDAZOLAM HCL 5 MG/5ML IJ SOLN
INTRAMUSCULAR | Status: DC | PRN
  Administered 2017-09-07: 2 mg via INTRAVENOUS
  Administered 2017-09-07 (×2): 1 mg via INTRAVENOUS

## 2017-09-07 MED ORDER — SODIUM CHLORIDE 0.9 % IV SOLN
INTRAVENOUS | Status: DC
  Administered 2017-09-07: 09:00:00 via INTRAVENOUS

## 2017-09-07 MED ORDER — MEPERIDINE HCL 100 MG/ML IJ SOLN
INTRAMUSCULAR | Status: DC | PRN
  Administered 2017-09-07 (×2): 25 mg via INTRAVENOUS

## 2017-09-07 MED ORDER — MIDAZOLAM HCL 5 MG/5ML IJ SOLN
INTRAMUSCULAR | Status: AC
  Filled 2017-09-07: qty 10

## 2017-09-07 MED ORDER — STERILE WATER FOR IRRIGATION IR SOLN
Status: DC | PRN
  Administered 2017-09-07: 100 mL

## 2017-09-07 MED ORDER — MEPERIDINE HCL 100 MG/ML IJ SOLN
INTRAMUSCULAR | Status: AC
  Filled 2017-09-07: qty 2

## 2017-09-07 NOTE — Op Note (Addendum)
[facility] Patient Name: Karen Freeman Procedure Date: 09/07/2017 9:02 AM MRN: [id number] Date of Birth: 04-08-1951 Attending MD: Barney Drain MD, MD CSN: [id number] Age: 67 Admit Type: Outpatient Procedure:                Colonoscopy WITH COLD FORCEPS POLYPECTOMY Indications:              Screening for colorectal malignant neoplasm Providers:                Barney Drain MD, MD, Rosina Lowenstein, RN, Nelma Rothman,                            Technician Referring MD:             Edwinna Areola. [place] Crane MD Medicines:                Meperidine 50 mg IV, Midazolam 4 mg IV Complications:            No immediate complications. Estimated Blood Loss:     Estimated blood loss was minimal. Procedure:                Pre-Anesthesia Assessment:                           - Prior to the procedure, a History and Physical                            was performed, and patient medications and                            allergies were reviewed. The patient's tolerance of                            previous anesthesia was also reviewed. The risks                            and benefits of the procedure and the sedation                            options and risks were discussed with the patient.                            All questions were answered, and informed consent                            was obtained. Prior Anticoagulants: The patient has                            taken no previous anticoagulant or antiplatelet                            agents. ASA Grade Assessment: II - A patient with                            mild systemic disease. After reviewing the risks  and benefits, the patient was deemed in                            satisfactory condition to undergo the procedure.                            After obtaining informed consent, the colonoscope                            was passed under direct vision. Throughout the                            procedure, the patient's blood  pressure, pulse, and                            oxygen saturations were monitored continuously. The                            EC-[id number] ([id number]) scope was introduced through                            the anus and advanced to the the cecum, identified                            by appendiceal orifice and ileocecal valve. The                            colonoscopy was technically difficult and complex                            due to significant looping AT THE RECTOSIGMOID                            JUNCTION. PT SUPINE AND COLOWARP IN PLACE TO REACH                            THE CECUM. Successful completion of the procedure                            was aided by changing the patient to a supine                            position and COLOWRAP. The patient tolerated the                            procedure well. The quality of the bowel                            preparation was excellent. The ileocecal valve,                            appendiceal orifice, and rectum were photographed. Scope In: 9:21:11 AM Scope Out: 9:36:24 AM Scope Withdrawal Time: 0 hours 9 minutes  22 seconds  Total Procedure Duration: 0 hours 15 minutes 13 seconds  Findings:      Two sessile polyps were found in the rectum and recto-sigmoid colon. The       polyps were 2 to 3 mm in size. These polyps were removed with a cold       biopsy forceps. Resection and retrieval were complete.      The recto-sigmoid colon revealed significantly excessive looping.      External and internal hemorrhoids were found during retroflexion. The       hemorrhoids were moderate.      A few small and large-mouthed diverticula were found in the       recto-sigmoid colon and sigmoid colon. Impression:               - Two 2 to 3 mm polyps in the rectum and at the                            recto-sigmoid colon, removed with a cold biopsy                            forceps. Resected and retrieved.                           - There was  significant looping of the colon.                           - External and internal hemorrhoids.                           - MODERATE DIVERTICULOSIS OF THE RECTOSIGMOID COLON                            WITH SIGNIFICANT REDUNDACY Moderate Sedation:      Moderate (conscious) sedation was administered by the endoscopy nurse       and supervised by the endoscopist. The following parameters were       monitored: oxygen saturation, heart rate, blood pressure, and response       to care. Total physician intraservice time was 24 minutes. Recommendation:           - Repeat colonoscopy in 5-10 years for surveillance                            WITH PEDS COLONOSCOPE.                           - High fiber diet.                           - Await pathology results.                           - Continue present medications.                           - Patient has a contact number available for  emergencies. The signs and symptoms of potential                            delayed complications were discussed with the                            patient. Return to normal activities tomorrow.                            Written discharge instructions were provided to the                            patient. Procedure Code(s):        --- Professional ---                           [id number], Colonoscopy, flexible; with biopsy, single                            or multiple                           99152, Moderate sedation services provided by the                            same physician or other qualified health care                            professional performing the diagnostic or                            therapeutic service that the sedation supports,                            requiring the presence of an independent trained                            observer to assist in the monitoring of the                            patient's level of consciousness and physiological                             status; initial 15 minutes of intraservice time,                            patient age 70 years or older                           [phone], Moderate sedation services; each additional                            15 minutes intraservice time Diagnosis Code(s):        --- Professional ---  Z12.11, Encounter for screening for malignant                            neoplasm of colon                           K62.1, Rectal polyp                           D12.7, Benign neoplasm of rectosigmoid junction                           K64.8, Other hemorrhoids CPT copyright 2016 American Medical Association. All rights reserved. The codes documented in this report are preliminary and upon coder review may  be revised to meet current compliance requirements. Barney Drain, MD Barney Drain MD, MD 09/07/2017 9:44:03 AM This report has been signed electronically. Number of Addenda: 0

## 2017-09-07 NOTE — H&P (Signed)
Primary Care Physician:  Celene Squibb, MD Primary Gastroenterologist:  Dr. Oneida Alar  Pre-Procedure History & Physical: HPI:  Karen Freeman is a 67 y.o. female here for [place].  Past Medical History:  Diagnosis Date  . Basal cell carcinoma (BCC) of back   . Family history of anesthesia complication    MH- sister, niece  . GERD (gastroesophageal reflux disease)   . Glaucoma   . Malignant hyperthermia    sister has MH  . Seasonal allergies     Past Surgical History:  Procedure Laterality Date  . ABDOMINAL HYSTERECTOMY     abdomen  . APPENDECTOMY    . Skin cancer removed from back and under left arm      Prior to Admission medications   Medication Sig Start Date End Date Taking? Authorizing Provider  acetaminophen (TYLENOL) 325 MG tablet Take 650 mg by mouth every 6 (six) hours as needed for moderate pain or headache.   Yes [provider]  albuterol (PROVENTIL HFA;VENTOLIN HFA) 108 (90 Base) MCG/ACT inhaler Inhale 1 puff into the lungs every 6 (six) hours as needed for wheezing or shortness of breath.   Yes [provider]  esomeprazole (NEXIUM) 40 MG capsule Take 40 mg by mouth daily before breakfast.   Yes [provider]  loratadine (CLARITIN) 10 MG tablet Take 10 mg by mouth at bedtime.    Yes [provider]  Na Sulfate-K Sulfate-Mg Sulf (SUPREP BOWEL PREP KIT) 17.5-3.13-1.6 GM/177ML SOLN Take 1 kit by mouth as directed. 08/09/17  Yes Fields, Sandi L, MD  Travoprost, BAK Free, (TRAVATAN) 0.004 % SOLN ophthalmic solution Place 1 drop into both eyes at bedtime.   Yes [provider]  calcium citrate-vitamin D (CITRACAL+D) 315-200 MG-UNIT per tablet Take 1 tablet by mouth 2 (two) times daily.    [provider]  Cholecalciferol (VITAMIN D) 2000 units CAPS Take 2,000 Units by mouth daily.    [provider]  denosumab (PROLIA) 60 MG/ML SOLN injection Inject 60 mg into the skin every 6 (six) months.  Administer in upper arm, thigh, or abdomen    [provider]  Multiple Vitamin (MULTIVITAMIN) capsule Take 1 capsule by mouth daily.    [provider]  vitamin B-12 (CYANOCOBALAMIN) 1000 MCG tablet Take 1,000 mcg by mouth daily.    [provider]  vitamin C (ASCORBIC ACID) 500 MG tablet Take 500 mg by mouth daily.    [provider]    Allergies as of 09/03/2017  . (No Known Allergies)    Family History  Problem Relation Age of Onset  . CAD Mother   . Diabetes type II Mother   . CAD Father   . Heart attack Father   . COPD Sister   . CAD Sister   . Malignant hyperthermia Sister   . Diabetes type II Sister   . Cancer - Lung Other   . Colon cancer Neg Hx     Social History   Socioeconomic History  . Marital status: Married    Spouse name: Not on file  . Number of children: Not on file  . Years of education: Not on file  . Highest education level: Not on file  Social Needs  . Financial resource strain: Not on file  . Food insecurity - worry: Not on file  . Food insecurity - inability: Not on file  . Transportation needs - medical: Not on file  . Transportation needs - non-medical: Not on  file  Occupational History  . Not on file  Tobacco Use  . Smoking status: Never Smoker  . Smokeless tobacco: Never Used  Substance and Sexual Activity  . Alcohol use: Yes    Comment: 1-2 glasses a week  . Drug use: No  . Sexual activity: Not on file  Other Topics Concern  . Not on file  Social History Narrative  . Not on file    Review of Systems: See HPI, otherwise negative ROS   Physical Exam: BP 137/80   Pulse 70   Temp 97.6 F (36.4 C) (Oral)   Resp (!) 22   Ht 5' 4" (1.626 m)   Wt 145 lb (65.8 kg)   SpO2 98%   BMI 24.89 kg/m  General:   Alert,  pleasant and cooperative in NAD Head:  Normocephalic and atraumatic. Neck:  Supple; Lungs:  Clear throughout to auscultation.    Heart:  Regular rate and rhythm. Abdomen:  Soft,  nontender and nondistended. Normal bowel sounds, without guarding, and without rebound.   Neurologic:  Alert and  oriented x4;  grossly normal neurologically.  Impression/Plan:     SCREENING  Plan:  1. TCS TODAY DISCUSSED PROCEDURE, BENEFITS, & RISKS: < 1% chance of medication reaction, bleeding, perforation, or rupture of spleen/liver.

## 2017-09-07 NOTE — Discharge Instructions (Signed)
You have MODERATE EXTERNAL hemorrhoids and diverticulosis IN YOUR LEFT COLON. YOU HAD TWO SMALL POLYPs REMOVED.    DRINK WATER TO KEEP YOUR URINE LIGHT YELLOW.  CONTINUE YOUR WEIGHT LOSS EFFORTS. YOUR BODY MASS INDEX IS OVER 30 WHICH MEANS YOU ARE OBESE. OBESITY IS ASSOCIATED WITH AN INCREASE FOR ALL CANCERS, INCLUDING ESOPHAGEAL AND COLON CANCER.  FOLLOW A HIGH FIBER DIET. AVOID ITEMS THAT CAUSE BLOATING. See info below.  YOUR BIOPSY RESULTS WILL BE AVAILABLE IN MY CHART AFTER FEB 6 AND MY OFFICE WILL CONTACT YOU IN 10-14 DAYS WITH YOUR RESULTS.   USE PREPARATION H FOUR TIMES  A DAY IF NEEDED TO RELIEVE RECTAL PAIN/PRESSURE/BLEEDING.  Next colonoscopy in 5-10 years.  Colonoscopy Care After Read the instructions outlined below and refer to this sheet in the next week. These discharge instructions provide you with general information on caring for yourself after you leave the hospital. While your treatment has been planned according to the most current medical practices available, unavoidable complications occasionally occur. If you have any problems or questions after discharge, call DR. FIELDS, [phone].  ACTIVITY  You may resume your regular activity, but move at a slower pace for the next 24 hours.   Take frequent rest periods for the next 24 hours.   Walking will help get rid of the air and reduce the bloated feeling in your belly (abdomen).   No driving for 24 hours (because of the medicine (anesthesia) used during the test).   You may shower.   Do not sign any important legal documents or operate any machinery for 24 hours (because of the anesthesia used during the test).    NUTRITION  Drink plenty of fluids.   You may resume your normal diet as instructed by your doctor.   Begin with a light meal and progress to your normal diet. Heavy or fried foods are harder to digest and may make you feel sick to your stomach (nauseated).   Avoid alcoholic beverages for 24  hours or as instructed.    MEDICATIONS  You may resume your normal medications.   WHAT YOU CAN EXPECT TODAY  Some feelings of bloating in the abdomen.   Passage of more gas than usual.   Spotting of blood in your stool or on the toilet paper  .  IF YOU HAD POLYPS REMOVED DURING THE COLONOSCOPY:  Eat a soft diet IF YOU HAVE NAUSEA, BLOATING, ABDOMINAL PAIN, OR VOMITING.    FINDING OUT THE RESULTS OF YOUR TEST Not all test results are available during your visit. DR. Oneida Alar WILL CALL YOU WITHIN 14 DAYS OF YOUR PROCEDUE WITH YOUR RESULTS. Do not assume everything is normal if you have not heard from DR. FIELDS, CALL HER OFFICE AT [phone].  SEEK IMMEDIATE MEDICAL ATTENTION AND CALL THE OFFICE: [phone] IF:  You have more than a spotting of blood in your stool.   Your belly is swollen (abdominal distention).   You are nauseated or vomiting.   You have a temperature over 101F.   You have abdominal pain or discomfort that is severe or gets worse throughout the day.  High-Fiber Diet A high-fiber diet changes your normal diet to include more whole grains, legumes, fruits, and vegetables. Changes in the diet involve replacing refined carbohydrates with unrefined foods. The calorie level of the diet is essentially unchanged. The Dietary Reference Intake (recommended amount) for adult males is 38 grams per day. For adult females, it is 25 grams per day. Pregnant and lactating women  should consume 28 grams of fiber per day. Fiber is the intact part of a plant that is not broken down during digestion. Functional fiber is fiber that has been isolated from the plant to provide a beneficial effect in the body. PURPOSE  Increase stool bulk.   Ease and regulate bowel movements.   Lower cholesterol.   REDUCE RISK OF COLON CANCER  INDICATIONS THAT YOU NEED MORE FIBER  Constipation and hemorrhoids.   Uncomplicated diverticulosis (intestine condition) and irritable bowel  syndrome.   Weight management.   As a protective measure against hardening of the arteries (atherosclerosis), diabetes, and cancer.   GUIDELINES FOR INCREASING FIBER IN THE DIET  Start adding fiber to the diet slowly. A gradual increase of about 5 more grams (2 slices of whole-wheat bread, 2 servings of most fruits or vegetables, or 1 bowl of high-fiber cereal) per day is best. Too rapid an increase in fiber may result in constipation, flatulence, and bloating.   Drink enough water and fluids to keep your urine clear or pale yellow. Water, juice, or caffeine-free drinks are recommended. Not drinking enough fluid may cause constipation.   Eat a variety of high-fiber foods rather than one type of fiber.   Try to increase your intake of fiber through using high-fiber foods rather than fiber pills or supplements that contain small amounts of fiber.   The goal is to change the types of food eaten. Do not supplement your present diet with high-fiber foods, but replace foods in your present diet.   INCLUDE A VARIETY OF FIBER SOURCES  Replace refined and processed grains with whole grains, canned fruits with fresh fruits, and incorporate other fiber sources. White rice, white breads, and most bakery goods contain little or no fiber.   Brown whole-grain rice, buckwheat oats, and many fruits and vegetables are all good sources of fiber. These include: broccoli, Brussels sprouts, cabbage, cauliflower, beets, sweet potatoes, white potatoes (skin on), carrots, tomatoes, eggplant, squash, berries, fresh fruits, and dried fruits.   Cereals appear to be the richest source of fiber. Cereal fiber is found in whole grains and bran. Bran is the fiber-rich outer coat of cereal grain, which is largely removed in refining. In whole-grain cereals, the bran remains. In breakfast cereals, the largest amount of fiber is found in those with "bran" in their names. The fiber content is sometimes indicated on the label.     You may need to include additional fruits and vegetables each day.   In baking, for 1 cup white flour, you may use the following substitutions:   1 cup whole-wheat flour minus 2 tablespoons.   1/2 cup white flour plus 1/2 cup whole-wheat flour.   Polyps, Colon  A polyp is extra tissue that grows inside your body. Colon polyps grow in the large intestine. The large intestine, also called the colon, is part of your digestive system. It is a long, hollow tube at the end of your digestive tract where your body makes and stores stool. Most polyps are not dangerous. They are benign. This means they are not cancerous. But over time, some types of polyps can turn into cancer. Polyps that are smaller than a pea are usually not harmful. But larger polyps could someday become or may already be cancerous. To be safe, doctors remove all polyps and test them.   PREVENTION There is not one sure way to prevent polyps. You might be able to lower your risk of getting them if you:  Eat  more fruits and vegetables and less fatty food.   Do not smoke.   Avoid alcohol.   Exercise every day.   Lose weight if you are overweight.   Eating more calcium and folate can also lower your risk of getting polyps. Some foods that are rich in calcium are milk, cheese, and broccoli. Some foods that are rich in folate are chickpeas, kidney beans, and spinach.    Diverticulosis Diverticulosis is a common condition that develops when small pouches (diverticula) form in the wall of the colon. The risk of diverticulosis increases with age. It happens more often in people who eat a low-fiber diet. Most individuals with diverticulosis have no symptoms. Those individuals with symptoms usually experience belly (abdominal) pain, constipation, or loose stools (diarrhea).  HOME CARE INSTRUCTIONS  Increase the amount of fiber in your diet as directed by your caregiver or dietician. This may reduce symptoms of diverticulosis.    Drink at least 6 to 8 glasses of water each day to prevent constipation.   Try not to strain when you have a bowel movement.   Avoiding nuts and seeds to prevent complications is NOT NECESSARY.   FOODS HAVING HIGH FIBER CONTENT INCLUDE:  Fruits. Apple, peach, pear, tangerine, raisins, prunes.   Vegetables. Brussels sprouts, asparagus, broccoli, cabbage, carrot, cauliflower, romaine lettuce, spinach, summer squash, tomato, winter squash, zucchini.   Starchy Vegetables. Baked beans, kidney beans, lima beans, split peas, lentils, potatoes (with skin).   Grains. Whole wheat bread, brown rice, bran flake cereal, plain oatmeal, white rice, shredded wheat, bran muffins.   SEEK IMMEDIATE MEDICAL CARE IF:  You develop increasing pain or severe bloating.   You have an oral temperature above 101F.   You develop vomiting or bowel movements that are bloody or black.   Hemorrhoids Hemorrhoids are dilated (enlarged) veins around the rectum. Sometimes clots will form in the veins. This makes them swollen and painful. These are called thrombosed hemorrhoids. Causes of hemorrhoids include:  Constipation.   Straining to have a bowel movement.   HEAVY LIFTING   HOME CARE INSTRUCTIONS  Eat a well balanced diet and drink 6 to 8 glasses of water every day to avoid constipation. You may also use a bulk laxative.   Avoid straining to have bowel movements.   Keep anal area dry and clean.   Do not use a donut shaped pillow or sit on the toilet for long periods. This increases blood pooling and pain.   Move your bowels when your body has the urge; this will require less straining and will decrease pain and pressure.

## 2017-09-11 ENCOUNTER — Encounter (HOSPITAL_COMMUNITY): Payer: Self-pay | Admitting: Gastroenterology

## 2017-09-12 NOTE — Progress Notes (Signed)
LMOM to call.

## 2017-09-12 NOTE — Progress Notes (Signed)
Pt is aware.  

## 2017-10-18 DIAGNOSIS — D692 Other nonthrombocytopenic purpura: Secondary | ICD-10-CM | POA: Diagnosis not present

## 2017-10-18 DIAGNOSIS — Z85828 Personal history of other malignant neoplasm of skin: Secondary | ICD-10-CM | POA: Diagnosis not present

## 2017-10-18 DIAGNOSIS — D225 Melanocytic nevi of trunk: Secondary | ICD-10-CM | POA: Diagnosis not present

## 2017-10-18 DIAGNOSIS — L821 Other seborrheic keratosis: Secondary | ICD-10-CM | POA: Diagnosis not present

## 2017-10-18 DIAGNOSIS — D2262 Melanocytic nevi of left upper limb, including shoulder: Secondary | ICD-10-CM | POA: Diagnosis not present

## 2017-10-18 DIAGNOSIS — D1801 Hemangioma of skin and subcutaneous tissue: Secondary | ICD-10-CM | POA: Diagnosis not present

## 2017-10-22 DIAGNOSIS — H5201 Hypermetropia, right eye: Secondary | ICD-10-CM | POA: Diagnosis not present

## 2017-10-22 DIAGNOSIS — H401111 Primary open-angle glaucoma, right eye, mild stage: Secondary | ICD-10-CM | POA: Diagnosis not present

## 2017-11-28 DIAGNOSIS — M810 Age-related osteoporosis without current pathological fracture: Secondary | ICD-10-CM | POA: Diagnosis not present

## 2017-11-28 DIAGNOSIS — H409 Unspecified glaucoma: Secondary | ICD-10-CM | POA: Diagnosis not present

## 2017-11-28 DIAGNOSIS — J45998 Other asthma: Secondary | ICD-10-CM | POA: Diagnosis not present

## 2017-11-28 DIAGNOSIS — R7301 Impaired fasting glucose: Secondary | ICD-10-CM | POA: Diagnosis not present

## 2017-11-28 DIAGNOSIS — E782 Mixed hyperlipidemia: Secondary | ICD-10-CM | POA: Diagnosis not present

## 2017-11-28 DIAGNOSIS — K219 Gastro-esophageal reflux disease without esophagitis: Secondary | ICD-10-CM | POA: Diagnosis not present

## 2017-12-03 ENCOUNTER — Encounter (HOSPITAL_COMMUNITY): Payer: Self-pay

## 2017-12-03 ENCOUNTER — Encounter (HOSPITAL_COMMUNITY)
Admission: RE | Admit: 2017-12-03 | Discharge: 2017-12-03 | Disposition: A | Discharge status: Home / Self Care | Payer: Medicare HMO | Source: Ambulatory Visit | Attending: Internal Medicine | Admitting: Internal Medicine

## 2017-12-03 DIAGNOSIS — M810 Age-related osteoporosis without current pathological fracture: Secondary | ICD-10-CM | POA: Insufficient documentation

## 2017-12-03 MED ORDER — DENOSUMAB 60 MG/ML ~~LOC~~ SOSY
60.0000 mg | PREFILLED_SYRINGE | Freq: Once | SUBCUTANEOUS | Status: AC
  Administered 2017-12-03: 60 mg via SUBCUTANEOUS
  Filled 2017-12-03: qty 1

## 2017-12-06 DIAGNOSIS — E782 Mixed hyperlipidemia: Secondary | ICD-10-CM | POA: Diagnosis not present

## 2017-12-06 DIAGNOSIS — J45998 Other asthma: Secondary | ICD-10-CM | POA: Diagnosis not present

## 2017-12-06 DIAGNOSIS — M5020 Other cervical disc displacement, unspecified cervical region: Secondary | ICD-10-CM | POA: Diagnosis not present

## 2017-12-06 DIAGNOSIS — M810 Age-related osteoporosis without current pathological fracture: Secondary | ICD-10-CM | POA: Diagnosis not present

## 2017-12-06 DIAGNOSIS — R7301 Impaired fasting glucose: Secondary | ICD-10-CM | POA: Diagnosis not present

## 2017-12-06 DIAGNOSIS — Z6830 Body mass index (BMI) 30.0-30.9, adult: Secondary | ICD-10-CM | POA: Diagnosis not present

## 2017-12-06 DIAGNOSIS — K219 Gastro-esophageal reflux disease without esophagitis: Secondary | ICD-10-CM | POA: Diagnosis not present

## 2018-03-18 DIAGNOSIS — J189 Pneumonia, unspecified organism: Secondary | ICD-10-CM | POA: Diagnosis not present

## 2018-04-11 DIAGNOSIS — J189 Pneumonia, unspecified organism: Secondary | ICD-10-CM | POA: Diagnosis not present

## 2018-04-11 DIAGNOSIS — Z6830 Body mass index (BMI) 30.0-30.9, adult: Secondary | ICD-10-CM | POA: Diagnosis not present

## 2018-04-22 ENCOUNTER — Ambulatory Visit (HOSPITAL_COMMUNITY)
Admission: RE | Admit: 2018-04-22 | Discharge: 2018-04-22 | Disposition: A | Discharge status: Home / Self Care | Payer: Medicare HMO | Source: Ambulatory Visit | Attending: Adult Health Nurse Practitioner | Admitting: Adult Health Nurse Practitioner

## 2018-04-22 ENCOUNTER — Other Ambulatory Visit (HOSPITAL_COMMUNITY): Payer: Self-pay | Admitting: Adult Health Nurse Practitioner

## 2018-04-22 DIAGNOSIS — J189 Pneumonia, unspecified organism: Secondary | ICD-10-CM | POA: Diagnosis not present

## 2018-04-26 DIAGNOSIS — H401111 Primary open-angle glaucoma, right eye, mild stage: Secondary | ICD-10-CM | POA: Diagnosis not present

## 2018-05-24 ENCOUNTER — Other Ambulatory Visit (HOSPITAL_COMMUNITY): Payer: Self-pay | Admitting: Internal Medicine

## 2018-05-24 DIAGNOSIS — Z1231 Encounter for screening mammogram for malignant neoplasm of breast: Secondary | ICD-10-CM

## 2018-06-05 ENCOUNTER — Encounter (HOSPITAL_COMMUNITY): Payer: Self-pay

## 2018-06-05 ENCOUNTER — Encounter (HOSPITAL_COMMUNITY)
Admission: RE | Admit: 2018-06-05 | Discharge: 2018-06-05 | Disposition: A | Discharge status: Home / Self Care | Payer: Medicare HMO | Source: Ambulatory Visit | Attending: Internal Medicine | Admitting: Internal Medicine

## 2018-06-05 DIAGNOSIS — M810 Age-related osteoporosis without current pathological fracture: Secondary | ICD-10-CM | POA: Diagnosis not present

## 2018-06-05 MED ORDER — DENOSUMAB 60 MG/ML ~~LOC~~ SOSY
60.0000 mg | PREFILLED_SYRINGE | Freq: Once | SUBCUTANEOUS | Status: AC
  Administered 2018-06-05: 60 mg via SUBCUTANEOUS
  Filled 2018-06-05: qty 1

## 2018-06-11 DIAGNOSIS — E782 Mixed hyperlipidemia: Secondary | ICD-10-CM | POA: Diagnosis not present

## 2018-06-11 DIAGNOSIS — R7301 Impaired fasting glucose: Secondary | ICD-10-CM | POA: Diagnosis not present

## 2018-06-14 DIAGNOSIS — R0602 Shortness of breath: Secondary | ICD-10-CM | POA: Diagnosis not present

## 2018-06-14 DIAGNOSIS — R7301 Impaired fasting glucose: Secondary | ICD-10-CM | POA: Diagnosis not present

## 2018-06-14 DIAGNOSIS — Z23 Encounter for immunization: Secondary | ICD-10-CM | POA: Diagnosis not present

## 2018-06-14 DIAGNOSIS — J45998 Other asthma: Secondary | ICD-10-CM | POA: Diagnosis not present

## 2018-06-14 DIAGNOSIS — J668 Airway disease due to other specific organic dusts: Secondary | ICD-10-CM | POA: Diagnosis not present

## 2018-06-14 DIAGNOSIS — E782 Mixed hyperlipidemia: Secondary | ICD-10-CM | POA: Diagnosis not present

## 2018-06-14 DIAGNOSIS — M810 Age-related osteoporosis without current pathological fracture: Secondary | ICD-10-CM | POA: Diagnosis not present

## 2018-06-14 DIAGNOSIS — R05 Cough: Secondary | ICD-10-CM | POA: Diagnosis not present

## 2018-06-14 DIAGNOSIS — K219 Gastro-esophageal reflux disease without esophagitis: Secondary | ICD-10-CM | POA: Diagnosis not present

## 2018-06-14 DIAGNOSIS — H409 Unspecified glaucoma: Secondary | ICD-10-CM | POA: Diagnosis not present

## 2018-06-24 ENCOUNTER — Ambulatory Visit (HOSPITAL_COMMUNITY)
Admission: RE | Admit: 2018-06-24 | Discharge: 2018-06-24 | Disposition: A | Discharge status: Home / Self Care | Payer: Medicare HMO | Source: Ambulatory Visit | Attending: Internal Medicine | Admitting: Internal Medicine

## 2018-06-24 DIAGNOSIS — Z1231 Encounter for screening mammogram for malignant neoplasm of breast: Secondary | ICD-10-CM | POA: Diagnosis not present

## 2018-06-25 ENCOUNTER — Other Ambulatory Visit (HOSPITAL_COMMUNITY): Payer: Self-pay | Admitting: Internal Medicine

## 2018-06-25 DIAGNOSIS — Z780 Asymptomatic menopausal state: Secondary | ICD-10-CM

## 2018-07-12 ENCOUNTER — Ambulatory Visit (HOSPITAL_COMMUNITY)
Admission: RE | Admit: 2018-07-12 | Discharge: 2018-07-12 | Disposition: A | Discharge status: Home / Self Care | Payer: Medicare HMO | Source: Ambulatory Visit | Attending: Internal Medicine | Admitting: Internal Medicine

## 2018-07-12 DIAGNOSIS — M85851 Other specified disorders of bone density and structure, right thigh: Secondary | ICD-10-CM | POA: Diagnosis not present

## 2018-07-12 DIAGNOSIS — Z780 Asymptomatic menopausal state: Secondary | ICD-10-CM | POA: Insufficient documentation

## 2018-07-12 DIAGNOSIS — M810 Age-related osteoporosis without current pathological fracture: Secondary | ICD-10-CM | POA: Diagnosis not present

## 2018-12-04 ENCOUNTER — Inpatient Hospital Stay (HOSPITAL_COMMUNITY): Admission: RE | Admit: 2018-12-04 | Payer: Medicare HMO | Source: Ambulatory Visit

## 2018-12-27 ENCOUNTER — Other Ambulatory Visit: Payer: Self-pay

## 2018-12-27 ENCOUNTER — Encounter (HOSPITAL_COMMUNITY)
Admission: RE | Admit: 2018-12-27 | Discharge: 2018-12-27 | Disposition: A | Discharge status: Home / Self Care | Payer: Medicare Other | Source: Ambulatory Visit | Attending: Internal Medicine | Admitting: Internal Medicine

## 2018-12-27 ENCOUNTER — Encounter (HOSPITAL_COMMUNITY): Payer: Self-pay

## 2018-12-27 DIAGNOSIS — M810 Age-related osteoporosis without current pathological fracture: Secondary | ICD-10-CM | POA: Diagnosis not present

## 2018-12-27 MED ORDER — DENOSUMAB 60 MG/ML ~~LOC~~ SOSY
60.0000 mg | PREFILLED_SYRINGE | Freq: Once | SUBCUTANEOUS | Status: AC
  Administered 2018-12-27: 60 mg via SUBCUTANEOUS

## 2019-05-21 ENCOUNTER — Other Ambulatory Visit (HOSPITAL_COMMUNITY): Payer: Self-pay | Admitting: Internal Medicine

## 2019-05-21 DIAGNOSIS — Z1231 Encounter for screening mammogram for malignant neoplasm of breast: Secondary | ICD-10-CM

## 2019-06-26 IMAGING — DX DG CHEST 2V
2 series · 2 of 2 positions shown · non-contrast
Comparison: 05/20/2014

CLINICAL DATA: Recent pneumonia.  Unresolved.

EXAM:
CHEST - 2 VIEW

[chest pa]
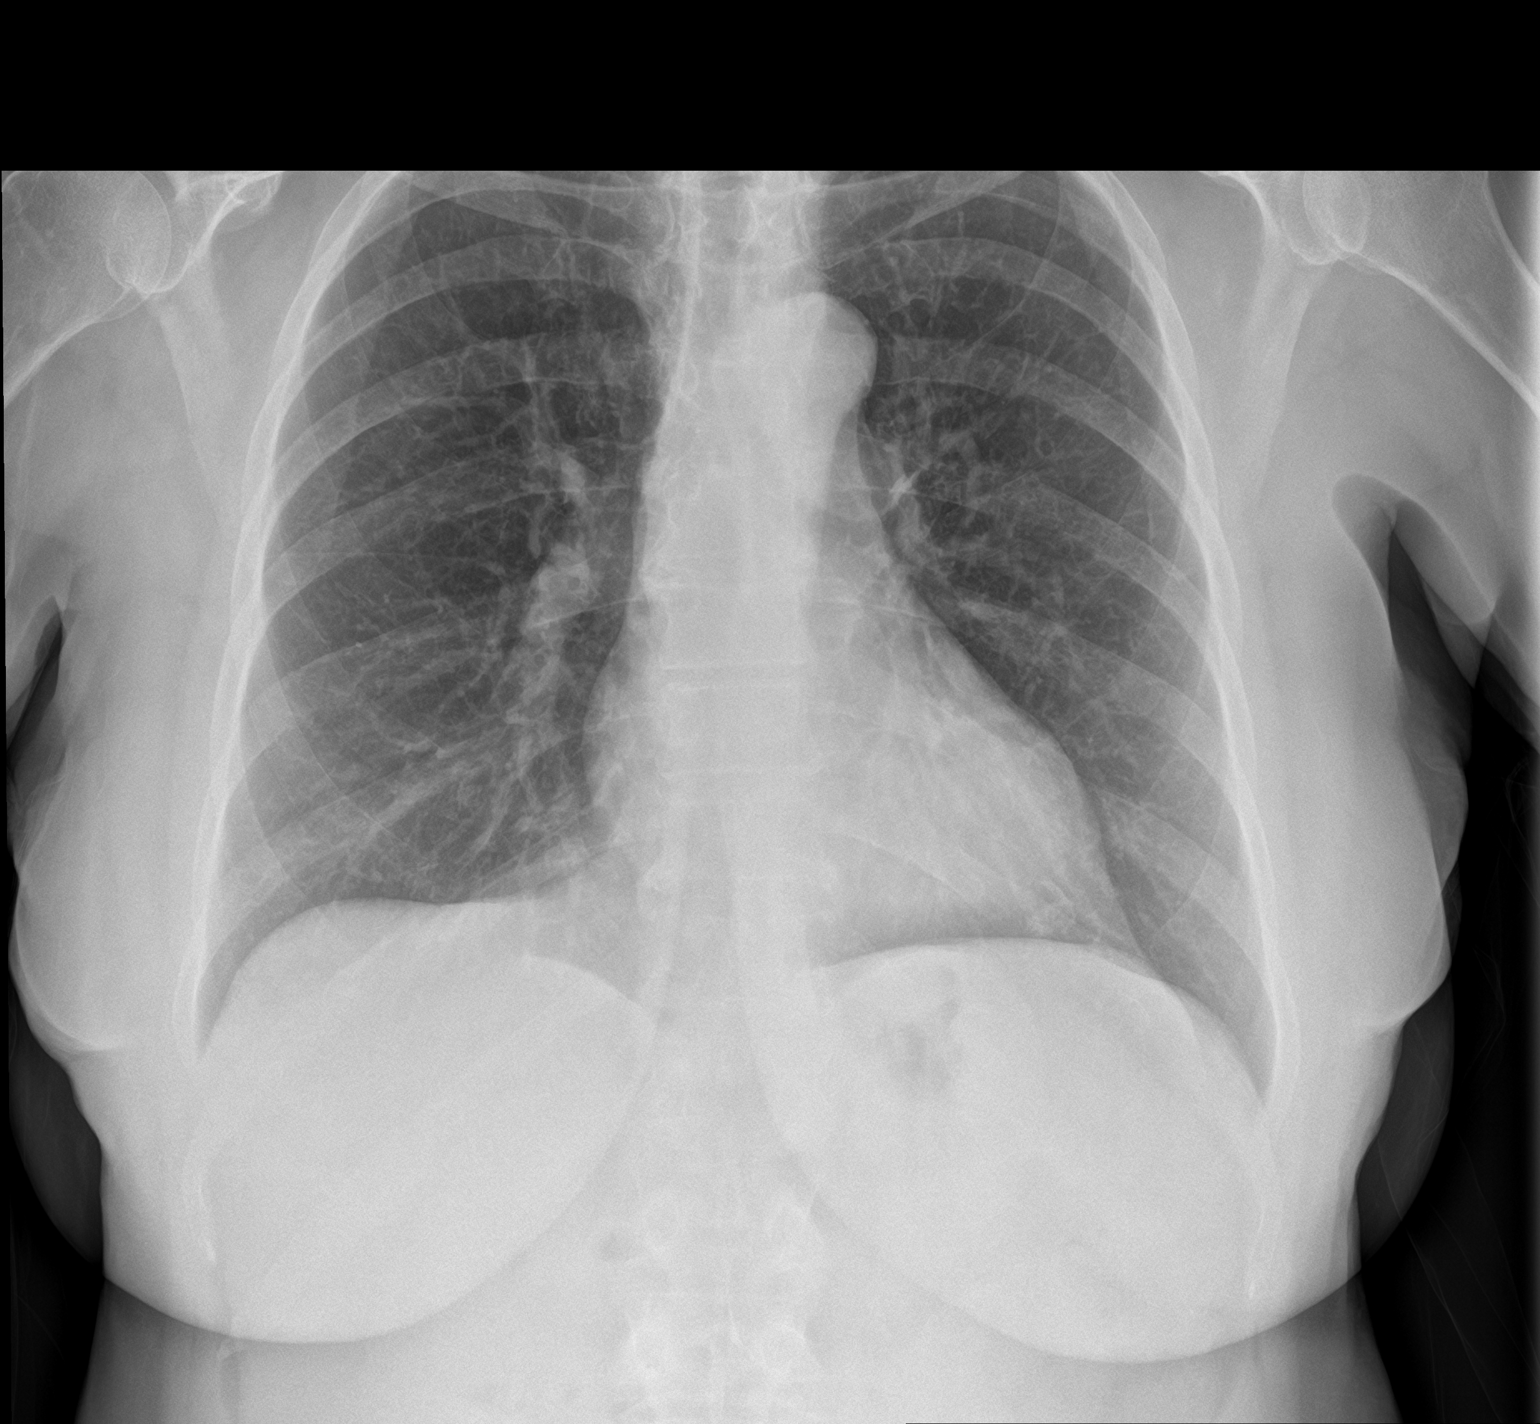

[chest lat]
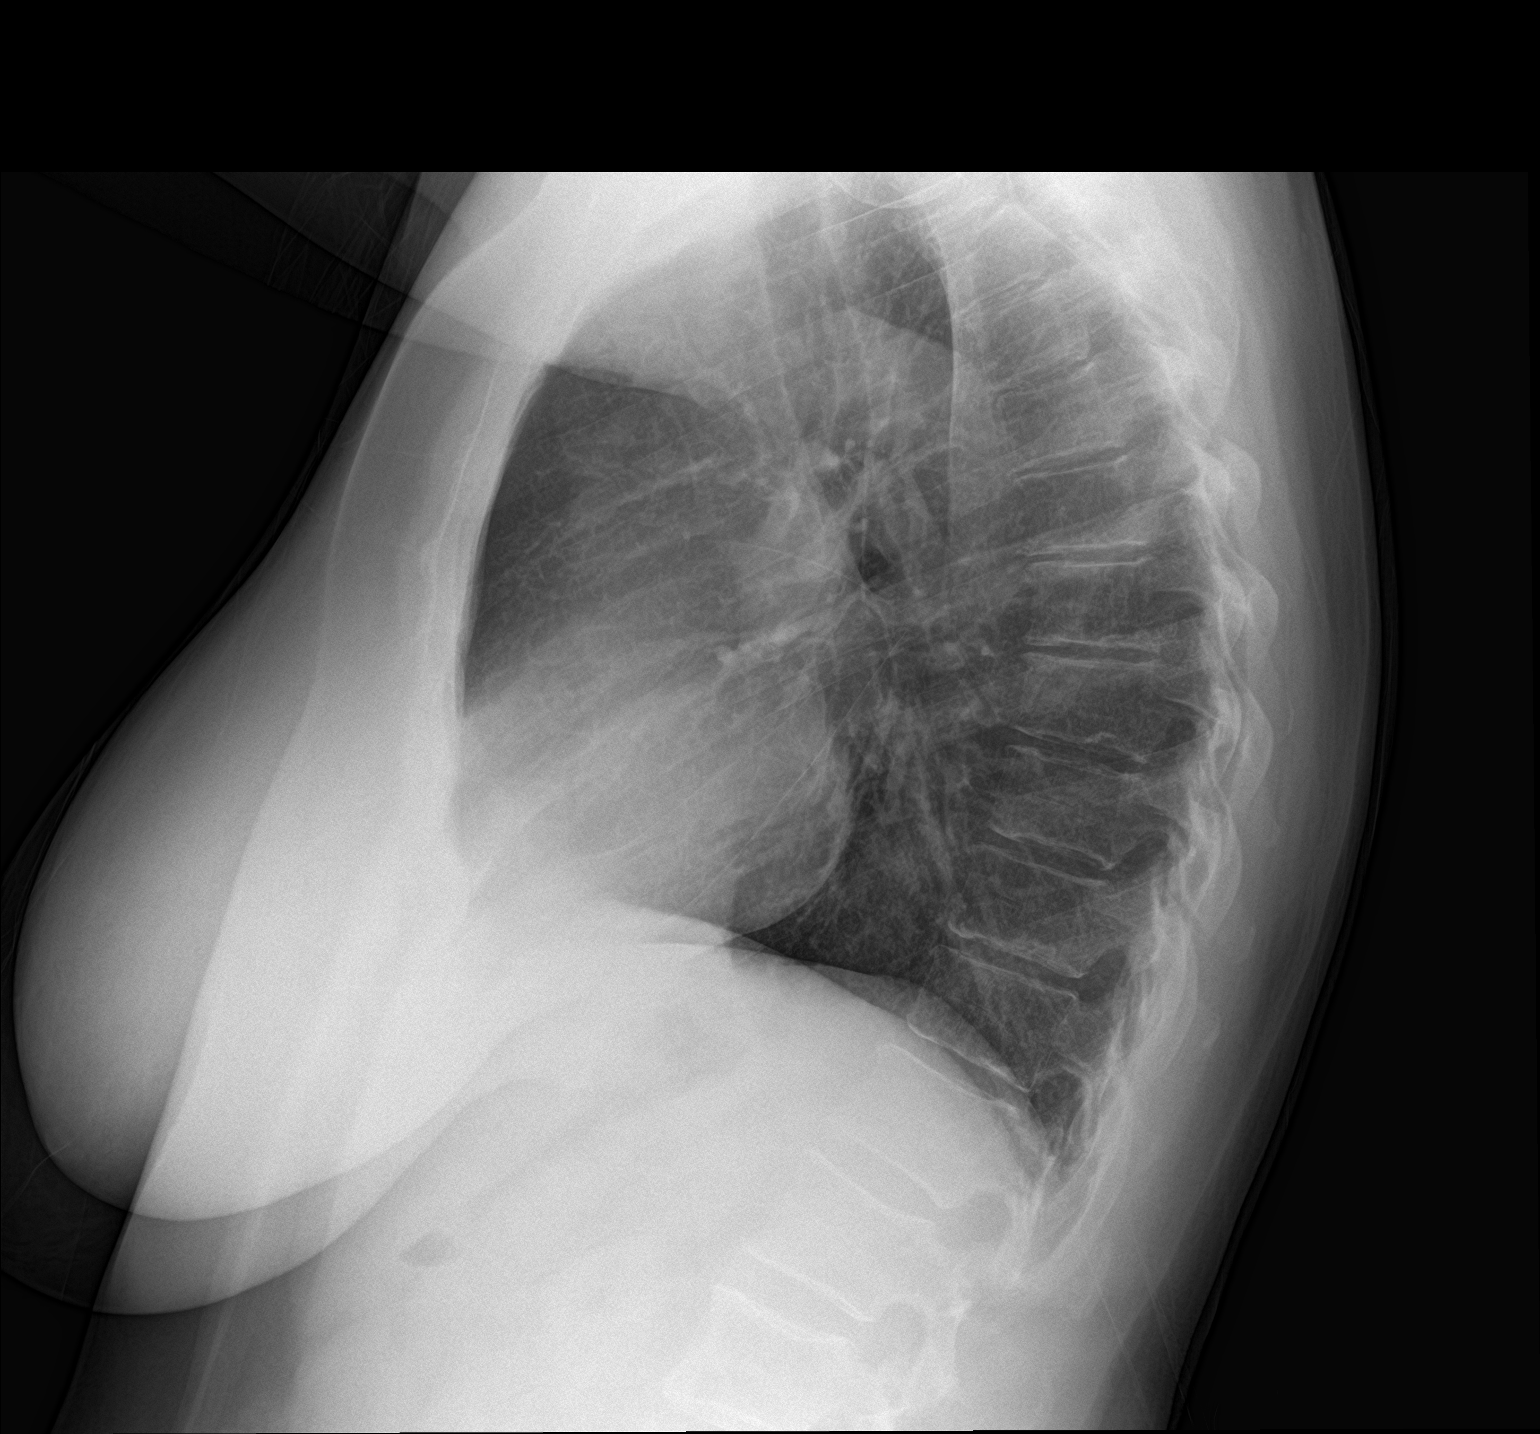

[2 of 2 positions shown; findings below may reference images not displayed]

FINDINGS: Heart is normal size. Lungs are clear. No effusions or pneumothorax.
No acute bony abnormality.
IMPRESSION: No active cardiopulmonary disease.

## 2019-06-27 ENCOUNTER — Ambulatory Visit (HOSPITAL_COMMUNITY)
Admission: RE | Admit: 2019-06-27 | Discharge: 2019-06-27 | Disposition: A | Discharge status: Home / Self Care | Payer: Medicare Other | Source: Ambulatory Visit | Attending: Internal Medicine | Admitting: Internal Medicine

## 2019-06-27 ENCOUNTER — Other Ambulatory Visit: Payer: Self-pay

## 2019-06-27 DIAGNOSIS — Z1231 Encounter for screening mammogram for malignant neoplasm of breast: Secondary | ICD-10-CM | POA: Diagnosis not present

## 2019-07-02 ENCOUNTER — Other Ambulatory Visit: Payer: Self-pay

## 2019-07-02 ENCOUNTER — Encounter (HOSPITAL_COMMUNITY): Payer: Self-pay

## 2019-07-02 ENCOUNTER — Encounter (HOSPITAL_COMMUNITY)
Admission: RE | Admit: 2019-07-02 | Discharge: 2019-07-02 | Disposition: A | Discharge status: Home / Self Care | Payer: Medicare Other | Source: Ambulatory Visit | Attending: Internal Medicine | Admitting: Internal Medicine

## 2019-07-02 DIAGNOSIS — M810 Age-related osteoporosis without current pathological fracture: Secondary | ICD-10-CM | POA: Insufficient documentation

## 2019-07-02 MED ORDER — DENOSUMAB 60 MG/ML ~~LOC~~ SOSY
60.0000 mg | PREFILLED_SYRINGE | Freq: Once | SUBCUTANEOUS | Status: AC
  Administered 2019-07-02: 60 mg via SUBCUTANEOUS

## 2019-07-29 ENCOUNTER — Ambulatory Visit (HOSPITAL_COMMUNITY): Payer: Medicare Other

## 2019-12-30 ENCOUNTER — Encounter (HOSPITAL_COMMUNITY)
Admission: RE | Admit: 2019-12-30 | Discharge: 2019-12-30 | Disposition: A | Discharge status: Home / Self Care | Payer: Medicare Other | Source: Ambulatory Visit | Attending: Internal Medicine | Admitting: Internal Medicine

## 2019-12-30 ENCOUNTER — Other Ambulatory Visit: Payer: Self-pay

## 2019-12-30 DIAGNOSIS — M810 Age-related osteoporosis without current pathological fracture: Secondary | ICD-10-CM | POA: Insufficient documentation

## 2019-12-30 MED ORDER — DENOSUMAB 60 MG/ML ~~LOC~~ SOSY
60.0000 mg | PREFILLED_SYRINGE | Freq: Once | SUBCUTANEOUS | Status: AC
  Administered 2019-12-30: 60 mg via SUBCUTANEOUS

## 2020-05-24 ENCOUNTER — Other Ambulatory Visit (HOSPITAL_COMMUNITY): Payer: Self-pay | Admitting: Internal Medicine

## 2020-05-24 DIAGNOSIS — Z1231 Encounter for screening mammogram for malignant neoplasm of breast: Secondary | ICD-10-CM

## 2020-06-01 ENCOUNTER — Other Ambulatory Visit (HOSPITAL_COMMUNITY): Payer: Self-pay | Admitting: Internal Medicine

## 2020-06-01 DIAGNOSIS — Z13820 Encounter for screening for osteoporosis: Secondary | ICD-10-CM

## 2020-06-28 ENCOUNTER — Ambulatory Visit (HOSPITAL_COMMUNITY)
Admission: RE | Admit: 2020-06-28 | Discharge: 2020-06-28 | Disposition: A | Discharge status: Home / Self Care | Payer: Medicare Other | Source: Ambulatory Visit | Attending: Internal Medicine | Admitting: Internal Medicine

## 2020-06-28 ENCOUNTER — Other Ambulatory Visit: Payer: Self-pay

## 2020-06-28 DIAGNOSIS — Z1231 Encounter for screening mammogram for malignant neoplasm of breast: Secondary | ICD-10-CM | POA: Diagnosis present

## 2020-06-30 ENCOUNTER — Encounter (HOSPITAL_COMMUNITY): Payer: Self-pay

## 2020-06-30 ENCOUNTER — Other Ambulatory Visit: Payer: Self-pay

## 2020-06-30 ENCOUNTER — Encounter (HOSPITAL_COMMUNITY)
Admission: RE | Admit: 2020-06-30 | Discharge: 2020-06-30 | Disposition: A | Discharge status: Home / Self Care | Payer: Medicare Other | Source: Ambulatory Visit | Attending: Internal Medicine | Admitting: Internal Medicine

## 2020-06-30 DIAGNOSIS — M810 Age-related osteoporosis without current pathological fracture: Secondary | ICD-10-CM | POA: Diagnosis present

## 2020-06-30 HISTORY — DX: Age-related osteoporosis without current pathological fracture: M81.0

## 2020-06-30 MED ORDER — DENOSUMAB 60 MG/ML ~~LOC~~ SOSY
60.0000 mg | PREFILLED_SYRINGE | Freq: Once | SUBCUTANEOUS | Status: AC
  Administered 2020-06-30: 60 mg via SUBCUTANEOUS

## 2020-07-05 ENCOUNTER — Ambulatory Visit (HOSPITAL_COMMUNITY): Payer: Medicare Other

## 2020-07-14 ENCOUNTER — Ambulatory Visit (HOSPITAL_COMMUNITY)
Admission: RE | Admit: 2020-07-14 | Discharge: 2020-07-14 | Disposition: A | Discharge status: Home / Self Care | Payer: Medicare Other | Source: Ambulatory Visit | Attending: Internal Medicine | Admitting: Internal Medicine

## 2020-07-14 ENCOUNTER — Other Ambulatory Visit: Payer: Self-pay

## 2020-07-14 DIAGNOSIS — Z780 Asymptomatic menopausal state: Secondary | ICD-10-CM | POA: Insufficient documentation

## 2020-07-14 DIAGNOSIS — M810 Age-related osteoporosis without current pathological fracture: Secondary | ICD-10-CM | POA: Insufficient documentation

## 2020-07-14 DIAGNOSIS — Z13820 Encounter for screening for osteoporosis: Secondary | ICD-10-CM

## 2020-12-28 ENCOUNTER — Other Ambulatory Visit: Payer: Self-pay

## 2020-12-28 ENCOUNTER — Encounter (HOSPITAL_COMMUNITY)
Admission: RE | Admit: 2020-12-28 | Discharge: 2020-12-28 | Disposition: A | Discharge status: Home / Self Care | Payer: Medicare Other | Source: Ambulatory Visit | Attending: Internal Medicine | Admitting: Internal Medicine

## 2020-12-28 DIAGNOSIS — M810 Age-related osteoporosis without current pathological fracture: Secondary | ICD-10-CM | POA: Insufficient documentation

## 2020-12-28 MED ORDER — DENOSUMAB 60 MG/ML ~~LOC~~ SOSY
PREFILLED_SYRINGE | SUBCUTANEOUS | Status: AC
  Filled 2020-12-28: qty 1

## 2020-12-28 MED ORDER — DENOSUMAB 60 MG/ML ~~LOC~~ SOSY
60.0000 mg | PREFILLED_SYRINGE | Freq: Once | SUBCUTANEOUS | Status: AC
  Administered 2020-12-28: 60 mg via SUBCUTANEOUS

## 2021-05-20 ENCOUNTER — Other Ambulatory Visit (HOSPITAL_COMMUNITY): Payer: Self-pay | Admitting: Internal Medicine

## 2021-05-20 DIAGNOSIS — Z1231 Encounter for screening mammogram for malignant neoplasm of breast: Secondary | ICD-10-CM

## 2021-06-28 ENCOUNTER — Encounter (HOSPITAL_COMMUNITY)
Admission: RE | Admit: 2021-06-28 | Discharge: 2021-06-28 | Disposition: A | Discharge status: Home / Self Care | Payer: Medicare Other | Source: Ambulatory Visit | Attending: Internal Medicine | Admitting: Internal Medicine

## 2021-06-28 DIAGNOSIS — M810 Age-related osteoporosis without current pathological fracture: Secondary | ICD-10-CM | POA: Diagnosis present

## 2021-06-28 DIAGNOSIS — Z1231 Encounter for screening mammogram for malignant neoplasm of breast: Secondary | ICD-10-CM | POA: Insufficient documentation

## 2021-06-28 MED ORDER — DENOSUMAB 60 MG/ML ~~LOC~~ SOSY
60.0000 mg | PREFILLED_SYRINGE | Freq: Once | SUBCUTANEOUS | Status: AC
  Administered 2021-06-28: 60 mg via SUBCUTANEOUS
  Filled 2021-06-28: qty 1

## 2021-07-04 ENCOUNTER — Encounter (HOSPITAL_COMMUNITY)
Admission: RE | Admit: 2021-07-04 | Discharge: 2021-07-04 | Disposition: A | Discharge status: Home / Self Care | Payer: Medicare Other | Source: Ambulatory Visit | Attending: Internal Medicine | Admitting: Internal Medicine

## 2021-07-04 ENCOUNTER — Other Ambulatory Visit: Payer: Self-pay

## 2021-07-04 DIAGNOSIS — Z1231 Encounter for screening mammogram for malignant neoplasm of breast: Secondary | ICD-10-CM

## 2021-07-04 DIAGNOSIS — M810 Age-related osteoporosis without current pathological fracture: Secondary | ICD-10-CM | POA: Diagnosis not present

## 2021-12-21 ENCOUNTER — Telehealth: Payer: Self-pay | Admitting: Pharmacy Technician

## 2021-12-21 ENCOUNTER — Other Ambulatory Visit: Payer: Self-pay

## 2021-12-21 DIAGNOSIS — M810 Age-related osteoporosis without current pathological fracture: Secondary | ICD-10-CM | POA: Insufficient documentation

## 2021-12-21 NOTE — Telephone Encounter (Addendum)
AMGEN BIV: COMPLETE  Auth Submission: APPROVED Payer: [employer] MEDIC[employer] Medication & CPT/J Code(s) submitted: Prolia (Denosumab) [id number] Route of submission (phone, fax, portal): [place] Auth type: Buy/Bill Units/visits requested: X2 DOSE Reference number: [id number] Approval from: 12/20/21 to 12/21/22 Last dose 06/28/21 Co-pay: $301

## 2021-12-27 ENCOUNTER — Encounter (HOSPITAL_COMMUNITY): Payer: Medicare Other

## 2021-12-27 ENCOUNTER — Ambulatory Visit (INDEPENDENT_AMBULATORY_CARE_PROVIDER_SITE_OTHER): Payer: Medicare Other

## 2021-12-27 VITALS — BP 148/93 | HR 72 | Temp 98.2°F | Resp 16 | Ht 60.0 in | Wt 155.8 lb

## 2021-12-27 DIAGNOSIS — M810 Age-related osteoporosis without current pathological fracture: Secondary | ICD-10-CM

## 2021-12-27 MED ORDER — DENOSUMAB 60 MG/ML ~~LOC~~ SOSY
60.0000 mg | PREFILLED_SYRINGE | Freq: Once | SUBCUTANEOUS | Status: AC
  Administered 2021-12-27: 60 mg via SUBCUTANEOUS

## 2021-12-27 NOTE — Progress Notes (Signed)
Diagnosis: osteoporosis   Provider:  Marshell Garfinkel, MD  Procedure: Injection  Prolia , Dose: 60 mg, Site: subcutaneous, Number of injections: 1  Discharge: Condition: Good, Destination: Home . AVS provided to patient.   Performed by:  Adelina Mings, LPN

## 2022-05-08 ENCOUNTER — Other Ambulatory Visit (HOSPITAL_COMMUNITY): Payer: Self-pay | Admitting: Internal Medicine

## 2022-05-08 DIAGNOSIS — Z13820 Encounter for screening for osteoporosis: Secondary | ICD-10-CM

## 2022-05-08 DIAGNOSIS — Z1231 Encounter for screening mammogram for malignant neoplasm of breast: Secondary | ICD-10-CM

## 2022-05-24 ENCOUNTER — Telehealth: Payer: Self-pay | Admitting: Pharmacy Technician

## 2022-05-24 NOTE — Telephone Encounter (Signed)
Auth Submission: APPROVED @ [facility]  Payer: [facility] MEDICARE Medication & CPT/J Code(s) submitted: Prolia (Denosumab) [phone] Route of submission (phone, fax, portal): PHONE Phone # Fax # Auth type: Buy/Bill Units/visits requested: X2 DOSES Reference number: [id number] Approval from: 05/24/22 to 05/25/23

## 2022-06-12 ENCOUNTER — Other Ambulatory Visit: Payer: Self-pay

## 2022-06-27 ENCOUNTER — Encounter (HOSPITAL_COMMUNITY)
Admission: RE | Admit: 2022-06-27 | Discharge: 2022-06-27 | Disposition: A | Discharge status: Home / Self Care | Payer: Medicare Other | Source: Ambulatory Visit | Attending: Internal Medicine | Admitting: Internal Medicine

## 2022-06-27 VITALS — BP 134/77 | HR 71 | Temp 97.8°F | Resp 16

## 2022-06-27 DIAGNOSIS — M810 Age-related osteoporosis without current pathological fracture: Secondary | ICD-10-CM | POA: Insufficient documentation

## 2022-06-27 MED ORDER — DENOSUMAB 60 MG/ML ~~LOC~~ SOSY
60.0000 mg | PREFILLED_SYRINGE | Freq: Once | SUBCUTANEOUS | Status: AC
  Administered 2022-06-27: 60 mg via SUBCUTANEOUS

## 2022-06-27 NOTE — Progress Notes (Signed)
Diagnosis: Osteoporosis  Provider:  Wende Neighbors MD  Procedure: Injection  Prolia (Denosumab), Dose: 60 mg, Site: subcutaneous, Number of injections: 1  Post Care: Patient declined observation  Discharge: Condition: Good, Destination: Home . AVS provided to patient.   Performed by:  Baxter Hire, RN

## 2022-07-03 ENCOUNTER — Ambulatory Visit: Payer: Medicare Other

## 2022-07-19 ENCOUNTER — Ambulatory Visit (HOSPITAL_COMMUNITY)
Admission: RE | Admit: 2022-07-19 | Discharge: 2022-07-19 | Disposition: A | Discharge status: Home / Self Care | Payer: Medicare Other | Source: Ambulatory Visit | Attending: Internal Medicine | Admitting: Internal Medicine

## 2022-07-19 DIAGNOSIS — Z1231 Encounter for screening mammogram for malignant neoplasm of breast: Secondary | ICD-10-CM | POA: Insufficient documentation

## 2022-07-19 DIAGNOSIS — Z13820 Encounter for screening for osteoporosis: Secondary | ICD-10-CM | POA: Diagnosis not present

## 2022-07-19 DIAGNOSIS — Z780 Asymptomatic menopausal state: Secondary | ICD-10-CM | POA: Diagnosis not present

## 2022-07-19 DIAGNOSIS — M810 Age-related osteoporosis without current pathological fracture: Secondary | ICD-10-CM | POA: Insufficient documentation

## 2022-08-09 ENCOUNTER — Other Ambulatory Visit: Payer: Self-pay | Admitting: Pharmacy Technician

## 2022-09-07 IMAGING — MG MM DIGITAL SCREENING BILAT W/ TOMO AND CAD
8 series · 8 of 24 positions shown · non-contrast
Comparison: Previous exam(s).

CLINICAL DATA: Screening.

EXAM:
DIGITAL SCREENING BILATERAL MAMMOGRAM WITH TOMOSYNTHESIS AND CAD
TECHNIQUE: Bilateral screening digital craniocaudal and mediolateral oblique
mammograms were obtained. Bilateral screening digital breast
tomosynthesis was performed. The images were evaluated with
computer-aided detection.

[R MLO synth-2D]
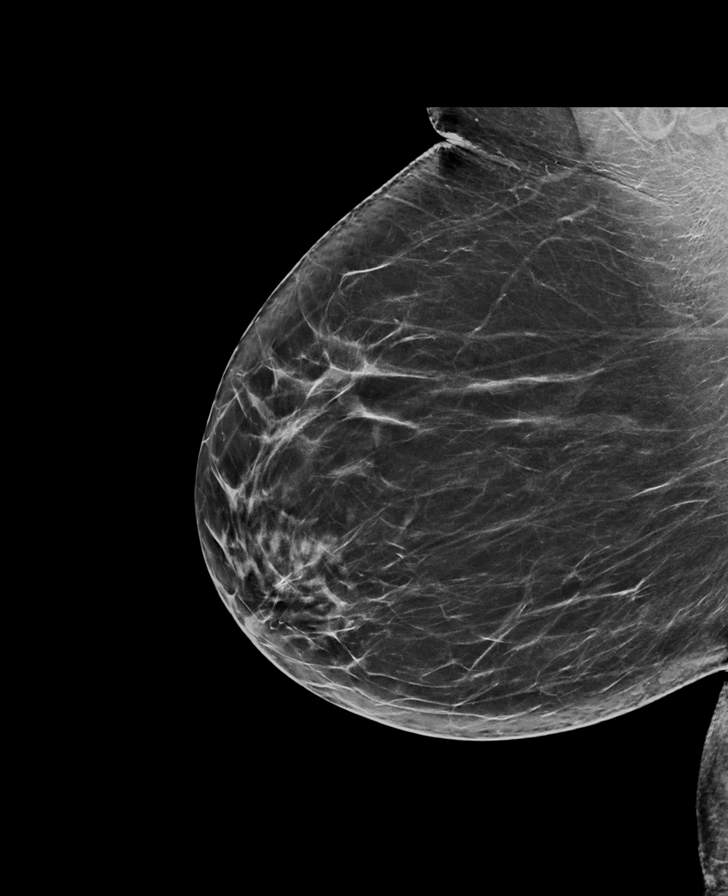

[R CC synth-2D]
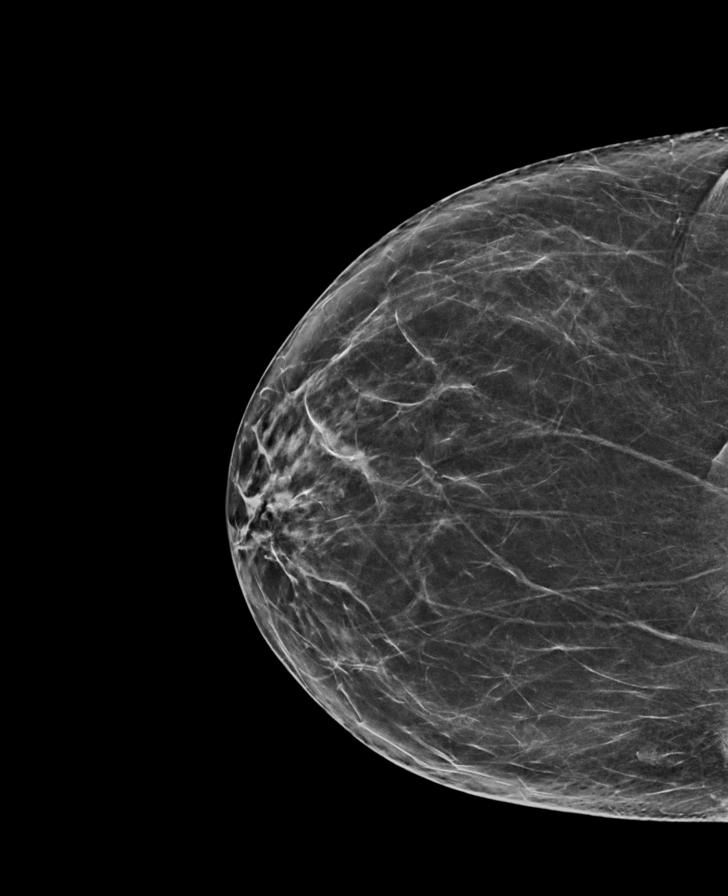

[L MLO synth-2D]
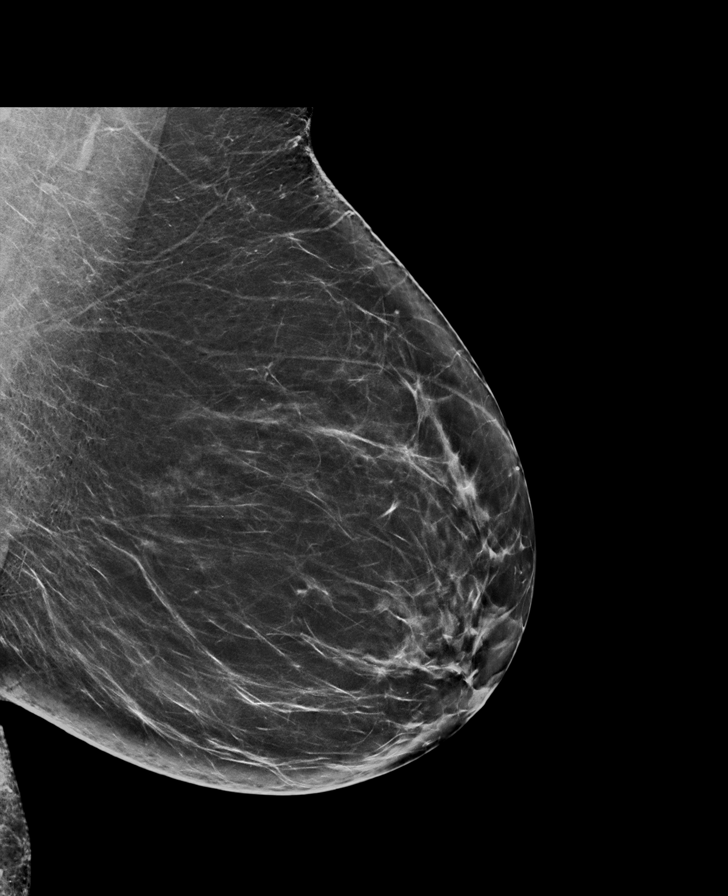

[L CC synth-2D]
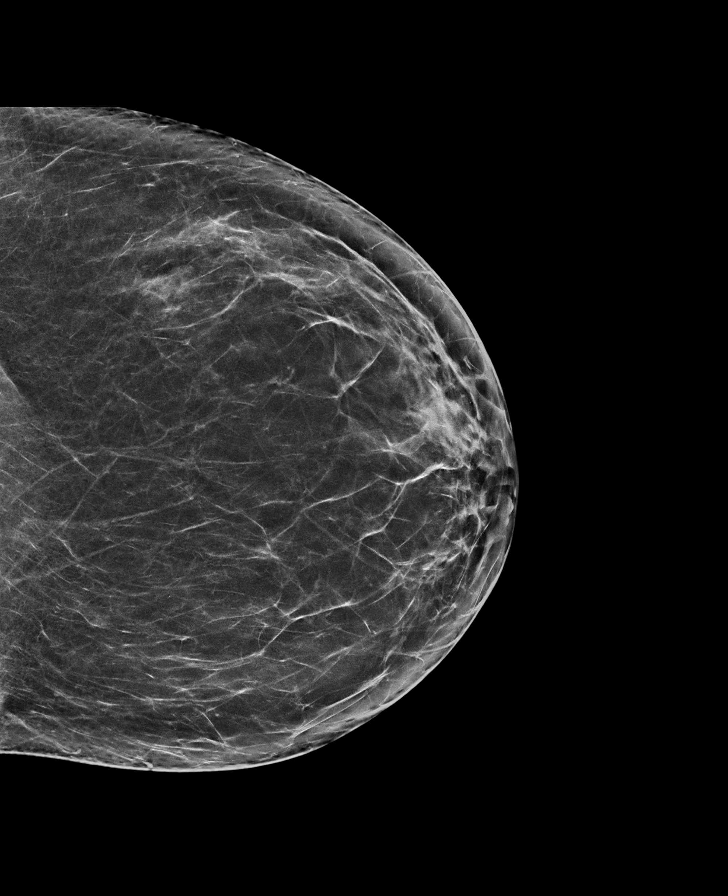

[L CC tomo · tomo slice 35/70.0]
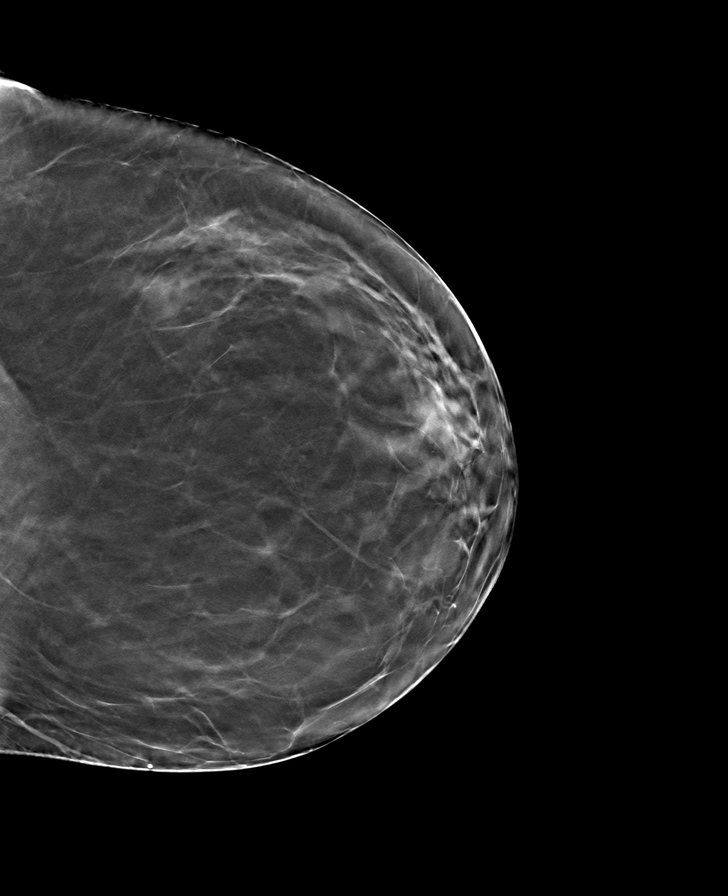

[L MLO tomo · tomo slice 39/78.0]
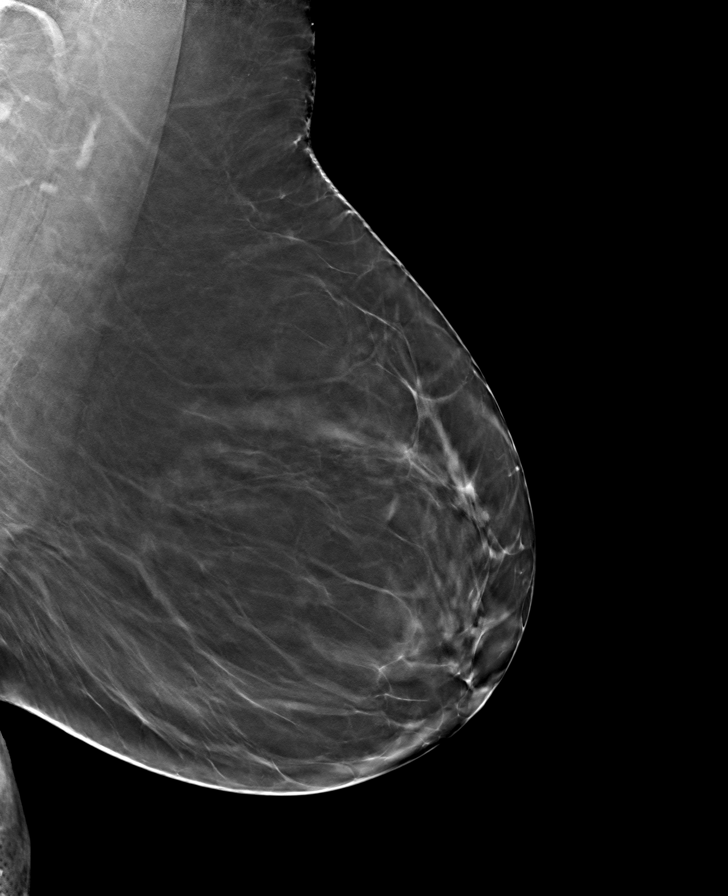

[R CC tomo · tomo slice 35/68.0]
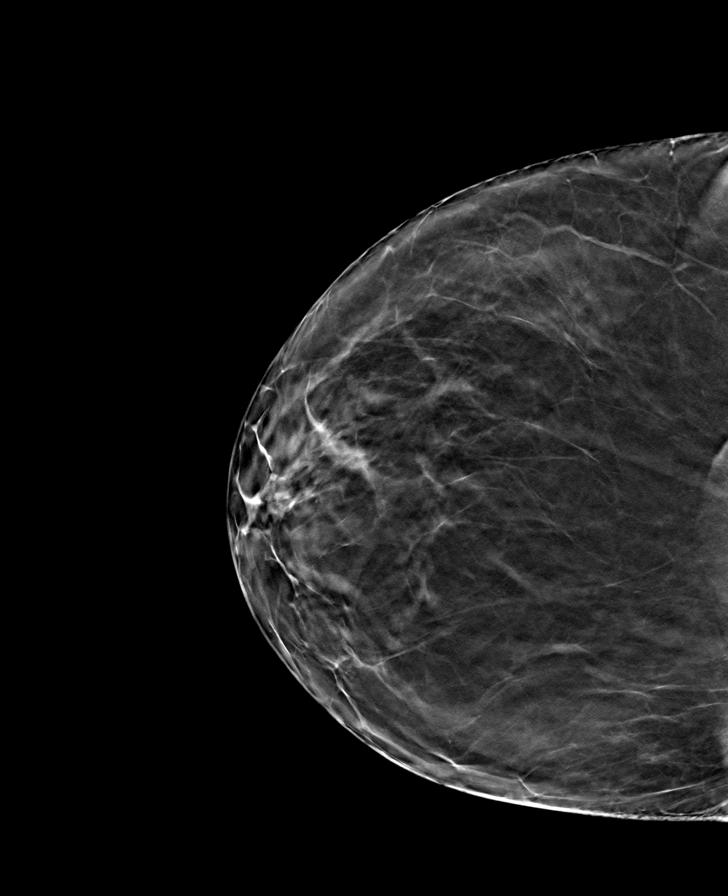

[R MLO tomo · tomo slice 39/76.0]
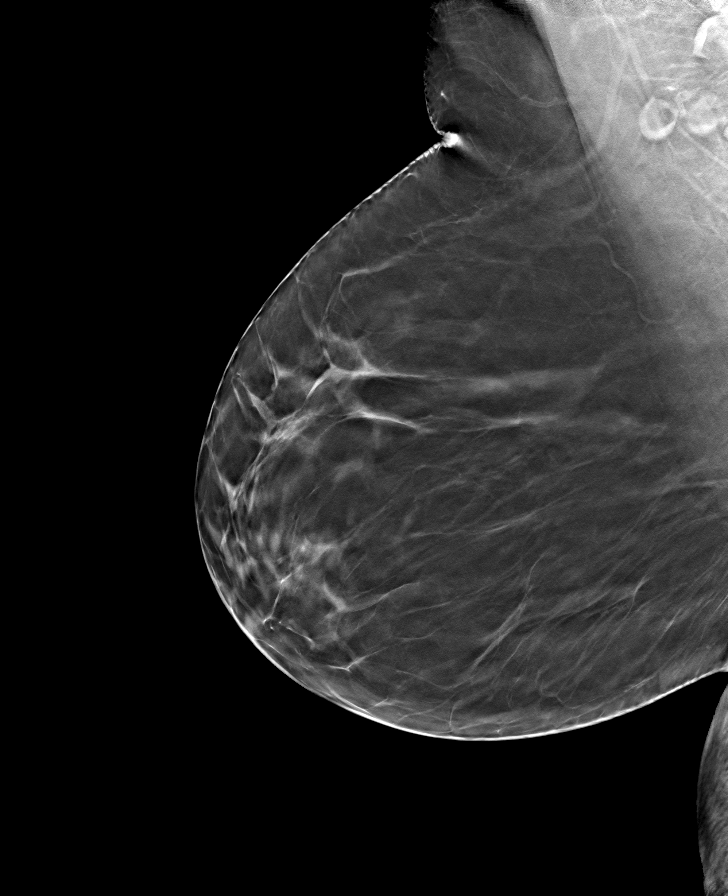

[8 of 24 positions shown; findings below may reference images not displayed]

ACR Breast Density Category b: There are scattered areas of
fibroglandular density.
FINDINGS: There are no findings suspicious for malignancy.
IMPRESSION: No mammographic evidence of malignancy. A result letter of this
screening mammogram will be mailed directly to the patient.

RECOMMENDATION:
Screening mammogram in one year. (Code:51-O-LD2)

BI-RADS CATEGORY  1: Negative.

## 2022-12-06 ENCOUNTER — Encounter (HOSPITAL_COMMUNITY)
Admission: RE | Admit: 2022-12-06 | Discharge: 2022-12-06 | Disposition: A | Discharge status: Home / Self Care | Payer: Medicare Other | Source: Ambulatory Visit | Attending: Internal Medicine | Admitting: Internal Medicine

## 2022-12-06 VITALS — BP 116/76 | HR 88 | Temp 97.9°F | Resp 20

## 2022-12-06 DIAGNOSIS — M810 Age-related osteoporosis without current pathological fracture: Secondary | ICD-10-CM

## 2022-12-06 MED ORDER — DENOSUMAB 60 MG/ML ~~LOC~~ SOSY
60.0000 mg | PREFILLED_SYRINGE | Freq: Once | SUBCUTANEOUS | Status: AC
  Administered 2022-12-06: 60 mg via SUBCUTANEOUS

## 2022-12-06 NOTE — Progress Notes (Signed)
Diagnosis: Osteoporosis  Provider:  Dwana Melena MD  Procedure: Injection  Prolia (Denosumab), Dose: 60 mg, Site: subcutaneous, Number of injections: 1  Administered in right arm.  Post Care: Patient declined observation  Discharge: Condition: Good, Destination: Home . AVS Provided  Performed by:  Wyvonne Lenz, RN

## 2023-02-21 ENCOUNTER — Other Ambulatory Visit: Payer: Self-pay

## 2023-04-12 ENCOUNTER — Telehealth: Payer: Self-pay

## 2023-04-12 ENCOUNTER — Other Ambulatory Visit: Payer: Self-pay

## 2023-04-12 NOTE — Telephone Encounter (Signed)
Auth Submission: APPROVED Site of care: Site of care: [facility] Payer: [employer] medicare Medication & CPT/J Code(s) submitted: Prolia (Denosumab) [id number] Route of submission (phone, fax, portal): portal Phone # Fax # Auth type: Buy/Bill PB Units/visits requested: 60mg , q51months Reference number: [id number] Approval from: 04/12/23 to 04/11/24

## 2023-06-08 ENCOUNTER — Encounter (HOSPITAL_COMMUNITY)
Admission: RE | Admit: 2023-06-08 | Discharge: 2023-06-08 | Disposition: A | Discharge status: Home / Self Care | Payer: Medicare Other | Source: Ambulatory Visit | Attending: Internal Medicine | Admitting: Internal Medicine

## 2023-06-08 ENCOUNTER — Encounter: Payer: Medicare Other | Admitting: *Deleted

## 2023-06-08 VITALS — BP 117/73 | HR 77 | Temp 97.8°F | Resp 18

## 2023-06-08 DIAGNOSIS — M810 Age-related osteoporosis without current pathological fracture: Secondary | ICD-10-CM

## 2023-06-08 MED ORDER — DENOSUMAB 60 MG/ML ~~LOC~~ SOSY
60.0000 mg | PREFILLED_SYRINGE | Freq: Once | SUBCUTANEOUS | Status: AC
  Administered 2023-06-08: 60 mg via SUBCUTANEOUS

## 2023-06-08 NOTE — Progress Notes (Signed)
 Diagnosis: Osteoporosis  Provider:  Dwana Melena MD  Procedure: Injection  Prolia (Denosumab), Dose: 60 mg, Site: subcutaneous, Number of injections: 1  Post Care: Observation period completed  Discharge: Condition: Good, Destination: Home . AVS Provided  Performed by:  Daleen Squibb, RN

## 2023-06-11 ENCOUNTER — Other Ambulatory Visit (HOSPITAL_COMMUNITY): Payer: Self-pay | Admitting: Internal Medicine

## 2023-06-11 DIAGNOSIS — Z1231 Encounter for screening mammogram for malignant neoplasm of breast: Secondary | ICD-10-CM

## 2023-07-23 ENCOUNTER — Ambulatory Visit (HOSPITAL_COMMUNITY)
Admission: RE | Admit: 2023-07-23 | Discharge: 2023-07-23 | Disposition: A | Discharge status: Home / Self Care | Payer: Medicare Other | Source: Ambulatory Visit | Attending: Internal Medicine | Admitting: Internal Medicine

## 2023-07-23 DIAGNOSIS — Z1231 Encounter for screening mammogram for malignant neoplasm of breast: Secondary | ICD-10-CM | POA: Insufficient documentation

## 2023-11-30 ENCOUNTER — Encounter: Payer: Self-pay | Admitting: Internal Medicine

## 2023-12-07 ENCOUNTER — Telehealth: Payer: Self-pay

## 2023-12-07 ENCOUNTER — Encounter: Payer: Self-pay | Admitting: Internal Medicine

## 2023-12-07 ENCOUNTER — Encounter: Payer: Medicare Other | Attending: Internal Medicine | Admitting: Emergency Medicine

## 2023-12-07 VITALS — BP 119/72 | HR 73 | Temp 98.1°F | Resp 16

## 2023-12-07 DIAGNOSIS — M810 Age-related osteoporosis without current pathological fracture: Secondary | ICD-10-CM | POA: Diagnosis not present

## 2023-12-07 MED ORDER — DENOSUMAB 60 MG/ML ~~LOC~~ SOSY
60.0000 mg | PREFILLED_SYRINGE | Freq: Once | SUBCUTANEOUS | Status: AC
  Administered 2023-12-07: 60 mg via SUBCUTANEOUS

## 2023-12-07 NOTE — Telephone Encounter (Signed)
 Auth Submission: NO AUTH NEEDED Site of care: Site of care: [facility] Payer: [employer] medicare Medication & CPT/J Code(s) submitted: Prolia  (Denosumab ) [id number] Route of submission (phone, fax, portal): phone/fax Phone # Fax # Auth type: Buy/Bill PB Units/visits requested: 60mg , q28months x2doses Reference number: [id number] Approval from: 12/07/23 to 08/06/24

## 2023-12-07 NOTE — Progress Notes (Signed)
 Diagnosis: Osteoporosis  Provider:  Dwana Melena MD  Procedure: Injection  Prolia (Denosumab), Dose: 60 mg, Site: subcutaneous, Number of injections: 1  Injection Site(s): Right lower quad. abdomne  Post Care: Patient declined observation  Discharge: Condition: Good, Destination: Home . AVS Provided  Performed by:  Arrie Senate, RN

## 2024-05-16 ENCOUNTER — Other Ambulatory Visit (HOSPITAL_COMMUNITY): Payer: Self-pay | Admitting: Internal Medicine

## 2024-06-09 ENCOUNTER — Encounter: Attending: Internal Medicine | Admitting: *Deleted

## 2024-06-09 VITALS — BP 135/87 | HR 67 | Temp 98.2°F | Resp 16

## 2024-06-09 DIAGNOSIS — M810 Age-related osteoporosis without current pathological fracture: Secondary | ICD-10-CM

## 2024-06-09 MED ORDER — DENOSUMAB 60 MG/ML ~~LOC~~ SOSY
60.0000 mg | PREFILLED_SYRINGE | Freq: Once | SUBCUTANEOUS | Status: AC
  Administered 2024-06-09: 60 mg via SUBCUTANEOUS

## 2024-06-09 NOTE — Progress Notes (Signed)
 Diagnosis: Osteoporosis  Provider:  Dwana Melena MD  Procedure: Injection  Prolia (Denosumab), Dose: 60 mg, Site: subcutaneous, Number of injections: 1  Injection Site(s): Right lower quad. abdomne  Post Care: Observation period completed  Discharge: Condition: Good, Destination: Home . AVS Provided  Performed by:  Daleen Squibb, RN

## 2024-06-12 ENCOUNTER — Other Ambulatory Visit (HOSPITAL_COMMUNITY): Payer: Self-pay | Admitting: Internal Medicine

## 2024-06-12 DIAGNOSIS — Z1231 Encounter for screening mammogram for malignant neoplasm of breast: Secondary | ICD-10-CM

## 2024-07-07 ENCOUNTER — Other Ambulatory Visit (HOSPITAL_COMMUNITY): Payer: Self-pay | Admitting: Internal Medicine

## 2024-07-07 DIAGNOSIS — M810 Age-related osteoporosis without current pathological fracture: Secondary | ICD-10-CM

## 2024-07-23 ENCOUNTER — Inpatient Hospital Stay (HOSPITAL_COMMUNITY): Admission: RE | Admit: 2024-07-23 | Discharge: 2024-07-23 | Attending: Internal Medicine | Admitting: Internal Medicine

## 2024-07-23 ENCOUNTER — Encounter (HOSPITAL_COMMUNITY): Payer: Self-pay

## 2024-07-23 DIAGNOSIS — Z1231 Encounter for screening mammogram for malignant neoplasm of breast: Secondary | ICD-10-CM | POA: Insufficient documentation

## 2024-07-23 DIAGNOSIS — M810 Age-related osteoporosis without current pathological fracture: Secondary | ICD-10-CM | POA: Diagnosis present

## 2024-12-08 ENCOUNTER — Ambulatory Visit
